# Patient Record
Sex: Male | Born: 1950 | Race: White | Hispanic: No | Marital: Married | Smoking: Never smoker
Health system: Southern US, Community
[De-identification: ages and names within clinical notes are randomized; demographics above are authoritative.]

## PROBLEM LIST (undated history)

## (undated) DIAGNOSIS — F329 Major depressive disorder, single episode, unspecified: Principal | ICD-10-CM

## (undated) DIAGNOSIS — M1990 Unspecified osteoarthritis, unspecified site: Secondary | ICD-10-CM

## (undated) DIAGNOSIS — K219 Gastro-esophageal reflux disease without esophagitis: Secondary | ICD-10-CM

## (undated) DIAGNOSIS — I8390 Asymptomatic varicose veins of unspecified lower extremity: Secondary | ICD-10-CM

## (undated) DIAGNOSIS — E785 Hyperlipidemia, unspecified: Secondary | ICD-10-CM

## (undated) DIAGNOSIS — E039 Hypothyroidism, unspecified: Secondary | ICD-10-CM

## (undated) DIAGNOSIS — M6740 Ganglion, unspecified site: Secondary | ICD-10-CM

## (undated) HISTORY — PX: COLONOSCOPY: SHX174

## (undated) HISTORY — DX: Major depressive disorder, single episode, unspecified: F32.9

## (undated) HISTORY — DX: Ganglion, unspecified site: M67.40

## (undated) HISTORY — DX: Hyperlipidemia, unspecified: E78.5

## (undated) HISTORY — DX: Asymptomatic varicose veins of unspecified lower extremity: I83.90

## (undated) HISTORY — DX: Hypothyroidism, unspecified: E03.9

---

## 2003-12-23 ENCOUNTER — Encounter: Payer: Self-pay | Admitting: Internal Medicine

## 2008-08-07 ENCOUNTER — Encounter: Payer: Self-pay | Admitting: Internal Medicine

## 2008-09-05 ENCOUNTER — Ambulatory Visit: Payer: Self-pay | Admitting: Gastroenterology

## 2008-11-14 ENCOUNTER — Ambulatory Visit: Payer: Self-pay | Admitting: Gastroenterology

## 2008-11-26 ENCOUNTER — Ambulatory Visit: Payer: Self-pay | Admitting: Gastroenterology

## 2008-12-05 ENCOUNTER — Telehealth: Payer: Self-pay | Admitting: Gastroenterology

## 2008-12-10 ENCOUNTER — Telehealth (INDEPENDENT_AMBULATORY_CARE_PROVIDER_SITE_OTHER): Payer: Self-pay | Admitting: *Deleted

## 2008-12-17 ENCOUNTER — Ambulatory Visit: Payer: Self-pay | Admitting: Internal Medicine

## 2008-12-17 DIAGNOSIS — M109 Gout, unspecified: Secondary | ICD-10-CM | POA: Insufficient documentation

## 2008-12-17 DIAGNOSIS — M79609 Pain in unspecified limb: Secondary | ICD-10-CM

## 2008-12-17 DIAGNOSIS — R21 Rash and other nonspecific skin eruption: Secondary | ICD-10-CM | POA: Insufficient documentation

## 2008-12-17 DIAGNOSIS — R0989 Other specified symptoms and signs involving the circulatory and respiratory systems: Secondary | ICD-10-CM | POA: Insufficient documentation

## 2008-12-22 ENCOUNTER — Encounter: Payer: Self-pay | Admitting: Internal Medicine

## 2008-12-22 ENCOUNTER — Ambulatory Visit: Payer: Self-pay

## 2009-05-01 ENCOUNTER — Ambulatory Visit: Payer: Self-pay | Admitting: Internal Medicine

## 2009-05-01 DIAGNOSIS — R609 Edema, unspecified: Secondary | ICD-10-CM

## 2009-05-01 DIAGNOSIS — M6740 Ganglion, unspecified site: Secondary | ICD-10-CM | POA: Insufficient documentation

## 2009-05-01 DIAGNOSIS — I8310 Varicose veins of unspecified lower extremity with inflammation: Secondary | ICD-10-CM

## 2009-06-24 ENCOUNTER — Encounter: Payer: Self-pay | Admitting: Internal Medicine

## 2009-06-24 ENCOUNTER — Ambulatory Visit: Payer: Self-pay | Admitting: Vascular Surgery

## 2009-07-27 ENCOUNTER — Ambulatory Visit: Payer: Self-pay | Admitting: Internal Medicine

## 2009-09-19 HISTORY — PX: VARICOSE VEIN SURGERY: SHX832

## 2009-10-02 ENCOUNTER — Ambulatory Visit: Payer: Self-pay | Admitting: Vascular Surgery

## 2009-10-02 ENCOUNTER — Encounter: Payer: Self-pay | Admitting: Internal Medicine

## 2009-11-25 ENCOUNTER — Ambulatory Visit: Payer: Self-pay | Admitting: Vascular Surgery

## 2009-12-02 ENCOUNTER — Encounter: Payer: Self-pay | Admitting: Internal Medicine

## 2009-12-02 ENCOUNTER — Ambulatory Visit: Payer: Self-pay | Admitting: Vascular Surgery

## 2010-04-12 ENCOUNTER — Telehealth: Payer: Self-pay | Admitting: Internal Medicine

## 2010-04-12 ENCOUNTER — Telehealth (INDEPENDENT_AMBULATORY_CARE_PROVIDER_SITE_OTHER): Payer: Self-pay | Admitting: *Deleted

## 2010-04-14 ENCOUNTER — Telehealth: Payer: Self-pay | Admitting: Internal Medicine

## 2010-10-19 NOTE — Progress Notes (Signed)
----   Converted from flag ---- ---- 04/12/2010 2:41 PM, Verdell Face wrote:   ---- 04/12/2010 12:30 PM, Verdell Face wrote: George Fitzgerald  (2051/08/07)  Pt requesting prescription for poison ivy, other the counter products are not working for him he requests something stronger, arm & elbows & knees have the rash.  [facility]  [phone] #   Elnita Maxwell ------------------------------

## 2010-10-19 NOTE — Progress Notes (Signed)
Summary: triamcinolone  Phone Note Refill Request Message from:  Pharmacy  Triamcinolon 0.5% is on back order until middle of Aug.....can we change med or strength?  please advise   Method Requested: Electronic Initial call taken by: Lanier Prude, [facility]),  April 14, 2010 5:03 PM  Follow-up for Phone Call        ok 0.25% Follow-up by: Tresa Garter MD,  April 14, 2010 5:25 PM    New/Updated Medications: TRIAMCINOLONE ACETONIDE 0.025 % CREA (TRIAMCINOLONE ACETONIDE) use two times a day as needed Prescriptions: TRIAMCINOLONE ACETONIDE 0.025 % CREA (TRIAMCINOLONE ACETONIDE) use two times a day as needed  #120 x 3   Entered by:   Lamar Sprinkles, CMA   Authorized by:   Tresa Garter MD   Signed by:   Lamar Sprinkles, CMA on 04/14/2010   Method used:   Electronically to        [facility] (retail)       [address]       [place], [place]  [address]       Ph: [phone]       Fax: [phone]   RxID:   [id number]

## 2010-10-19 NOTE — Letter (Signed)
Summary: [facility]   Imported By: Lester [place] 11/03/2009 10:35:33  _____________________________________________________________________  External Attachment:    Type:   Image     Comment:   External Document

## 2010-10-19 NOTE — Progress Notes (Signed)
Summary: POSION IVY  ---- Converted from flag ---- ---- 04/12/2010 12:30 PM, Verdell Face wrote: George Fitzgerald  ([birth year])  Pt requesting prescription for poison ivy, other the counter products are not working for him he requests something stronger, arm & elbows & knees have the rash.  [facility]  [phone] #   Elnita Maxwell ------------------------------     Follow-up for Phone Call       Follow-up by: Tresa Garter MD,  April 12, 2010 10:14 PM    Additional Follow-up for Phone Call Additional follow up Details #2::    ok Pred and Triamc Follow-up by: Tresa Garter MD,  April 12, 2010 10:15 PM  Additional Follow-up for Phone Call Additional follow up Details #3:: Details for Additional Follow-up Action Taken: Pt informed  Additional Follow-up by: Lamar Sprinkles, CMA,  April 13, 2010 9:18 AM  New/Updated Medications: PREDNISONE 10 MG TABS (PREDNISONE) Take 40mg  qd for 3 days, then 20 mg qd for 3 days, then 10mg  qd for 6 days, then stop. Take pc. TRIAMCINOLONE ACETONIDE 0.5 % CREA (TRIAMCINOLONE ACETONIDE) use two times a day prn Prescriptions: TRIAMCINOLONE ACETONIDE 0.5 % CREA (TRIAMCINOLONE ACETONIDE) use two times a day prn  #120 g x 3   Entered and Authorized by:   Tresa Garter MD   Signed by:   Lamar Sprinkles, CMA on 04/13/2010   Method used:   Electronically to        [facility] (retail)       [address]       [place], [place]  [address]       Ph: [phone]       Fax: [phone]   RxID:   [id number] PREDNISONE 10 MG TABS (PREDNISONE) Take 40mg  qd for 3 days, then 20 mg qd for 3 days, then 10mg  qd for 6 days, then stop. Take pc.  #24 x 1   Entered and Authorized by:   Tresa Garter MD   Signed by:   Lamar Sprinkles, CMA on 04/13/2010   Method used:   Electronically to        [facility] (retail)       [address]       [place], [place]  [address]       Ph:  [phone]       Fax: [phone]   RxID:   [id number]

## 2010-10-19 NOTE — Letter (Signed)
Summary: [facility]   Imported By: Lennie Odor 12/17/2009 16:14:59  _____________________________________________________________________  External Attachment:    Type:   Image     Comment:   External Document

## 2011-02-01 NOTE — Consult Note (Signed)
George Fitzgerald   CASH, DUCE  DOB:  10-02-1950                                       06/24/2009  ZOXWR#:[id number]   Patient presents today for evaluation of left leg varicose veins and  venous hypertension.  He reports that these varicosities have been  present for many years but have become progressively severe over the  past several years and is having increased pain associated with these.  He is quite active, though he is semi-retired.  He does a great deal of  standing and reports increased pain associated with prolonged standing.  He reports that the pain awakens him at right if he has been on his feet  quite a bit and has a throbbing sensation and also a sensation of heat  and distention over the varicosities themselves in his medial and  posterior calf on the left.  He does not have any history of deep venous  thrombosis or superficial thrombophlebitis.  His heath is good.  He does  have elevated cholesterol.  He does have a history of gallop and does  have a history of ganglion cyst.  He does not have a history of  diabetes.  He does not have a family history is negative for premature  atherosclerotic disease.   SOCIAL HISTORY:  He is married.  He is retired.  He does not smoke.  Does have several alcohol drinks per day.   REVIEW OF SYSTEMS:  Weight is reported at 192 pounds.  He is 6 feet 2  inches tall.  He does not have any cardiac, pulmonary, GI or GU  symptoms.  Has no neuro past history and no psychiatric past history.  He does have a history of gout and pain in his left leg from the  varicosities.   He does take Aleve for sustained relief for discomfort in his  varicosities.   There are no drug allergies.   His only other medications are low-dose aspirin, glucosamine, omega 3  fish oil, and vitamin D.   PHYSICAL EXAMINATION:  A well-developed and well-nourished white male  appearing stated age 60.  Blood pressure is 149/80.   Heart rates is 58.  Respirations are 18.  His radial and dorsalis pedis pulses are 2+  bilaterally.  Right leg has no evidence of venous hypertension.  The  left leg has marked varicosities in his medial calf and posterior calf.  He underwent noninvasive vascular laboratory studies and this shows  gross reflux throughout his great saphenous vein and flow into the  tributary varicosities.   I discussed the significance of his venous hypertension as the cause of  his pain and varicosities with the patient.  We have fitted him with  thigh-high graduated compression stockings 20 to 30 mmHg today.  I  explained the importance of the continued use of Aleve and elevation  when possible.  I did discuss the potential option of laser ablation of  his great saphenous vein and stab phlebectomy for relief symptoms should  the compression garments fail.  Plan to see him again in 3 months for  continued discussion.   Larina Earthly, M.D.  Electronically Signed   TFE/MEDQ  D:  06/24/2009  T:  06/25/2009  Job:  [id number]   cc:   Georgina Quint. Plotnikov, MD

## 2011-02-01 NOTE — Assessment & Plan Note (Signed)
OFFICE VISIT   George Fitzgerald, George Fitzgerald  DOB:  04-19-1951                                       10/02/2009  ZOXWR#:[id number]   The patient presents today for continue discussion regarding his venous  hypertension in his left leg.  He has no new major medical difficulties.   Review of systems is otherwise unchanged.  He reports that he continues  to have severe discomfort with his walking program.  He reports that he  has a throbbing sensation in his left calf and this is somewhat relieved  with exercise.  He does report that he has had no relief from the  compression garments.  He does take Aleve several times a day to help  with the pain as well.  He has stopped walking on a treadmill due to leg  pain and swelling and reports that he can only walk 10% of what he used  to walk due to pain.  He also has had to limit his yard work, mowing,  cleaning gutters, etc. due to leg pain as well.  He reports an aching  sensation at night after being up on his leg a great deal of time.   PHYSICAL EXAMINATION:  Unchanged.  He has a 2+ dorsalis pedis pulse.  Atraumatic, normocephalic.  Extraocular movements intact.  His skin is  without rashes.  He does have some hemosiderin deposits from the level  of his ankle.  His blood pressure today is 161/87, pulses 66,  respirations 18.  He has no major musculoskeletal deformities.  He is  grossly intact neurologically.   I reviewed his duplex with him and reimaged his vein for demonstration  for the patient with ultrasound showing markedly dilated great saphenous  vein throughout his left thigh extending into his tributary varicosities  in the calf popliteal space.  I have recommended we proceed with laser  ablation of his left great saphenous vein and stab phlebectomy of his  tributary varicosities.  He understands this is an outpatient procedure  in our office under local anesthesia.  We will schedule this at his  earliest  convenience.     Larina Earthly, M.D.  Electronically Signed   TFE/MEDQ  D:  10/02/2009  T:  10/02/2009  Job:  [id number]   cc:   Georgina Quint. Plotnikov, MD

## 2011-02-01 NOTE — Procedures (Signed)
DUPLEX DEEP VENOUS EXAM - LOWER EXTREMITY   INDICATION:  One week left greater saphenous vein ablation postop.   HISTORY:  Edema:  No.  Trauma/Surgery:  One week, left greater saphenous vein ablation.  Pain:  No.  PE:  No.  Previous DVT:  No.  Anticoagulants:  No.  Other:  No.   DUPLEX EXAM:                CFV   SFV   PopV  PTV    GSV                R  L  R  L  R  L  R   L  R  L  Thrombosis    o  o     o     o      o     +  Spontaneous   +  +     +     +      +     o  Phasic        +  +     +     +      +     o  Augmentation  +  +     +     +      +     o  Compressible  +  +     +     +      +     o  Competent     +  +     +     +      +     o   Legend:  + - yes  o - no  p - partial  D - decreased   IMPRESSION:  There does not appear to be any deep vein thrombus noted in  the left leg.  The left greater saphenous vein appears thrombosed at the  left saphenofemoral junction to knee level.    _____________________________  Larina Earthly, M.D.   CB/MEDQ  D:  12/02/2009  T:  12/02/2009  Job:  [id number]

## 2011-02-01 NOTE — Assessment & Plan Note (Signed)
OFFICE VISIT   George Fitzgerald, George Fitzgerald  DOB:  02-09-1951                                       11/25/2009  EAVWU#:[id number]   The patient presents today for treatment of his left leg venous  pathology.  He underwent laser ablation of his left great saphenous vein  from below his knee to his saphenofemoral junction and also stab  phlebectomy of multiple tributary varicosities between 10 and 20 over  his posterior medial calf.  He had no immediately complications and was  discharged to home and will be seen again in 1 week for repeat duplex.     Larina Earthly, M.D.  Electronically Signed   TFE/MEDQ  D:  11/25/2009  T:  11/26/2009  Job:  [id number]

## 2011-02-01 NOTE — Procedures (Signed)
LOWER EXTREMITY VENOUS REFLUX EXAM   INDICATION:  Bulky varicose veins.   EXAM:  Using color-flow imaging and pulse Doppler spectral analysis, the  left common femoral, superficial femoral, popliteal, posterior tibial,  greater and lesser saphenous veins are evaluated.  There is evidence  suggesting deep venous insufficiency in the left lower extremity.   The left saphenofemoral junction is not competent with reflux of >500  milliseconds.  The left GSV is not competent with reflux of >500  milliseconds, with the caliber as described below.   The left proximal short saphenous vein demonstrates competency.   GSV Diameter (used if found to be incompetent only)                                            Right         Left  Proximal Greater Saphenous Vein           cm            1.00 cm  Proximal-to-mid-thigh                     cm            0.76 cm  Mid thigh                                 cm            0.70 cm  Mid-distal thigh                          cm            cm  Distal thigh                              cm            0.77 cm  Knee                                      cm            0.58 cm   IMPRESSION:  The left greater saphenous vein reflux with >500  milliseconds is identified with the caliber ranging from 0.58 cm to 1.00  cm knee to groin.  The deep venous systems is not competent with reflux  of >500 milliseconds.  The left lesser saphenous vein is competent.   ___________________________________________  Larina Earthly, M.D.   CB/MEDQ  D:  06/24/2009  T:  06/24/2009  Job:  [id number]

## 2011-02-01 NOTE — Assessment & Plan Note (Signed)
OFFICE VISIT   George Fitzgerald, George Fitzgerald  DOB:  Oct 27, 1950                                       12/02/2009  ZOXWR#:[id number]   The patient presents today 1 week followup of left great saphenous vein  laser ablation and stab phlebectomy of multiple tributary varicosities.  He has had minimal discomfort following the procedure and has returned  to his usual activities.  He has nice healing of his stab sites and mild  bruising as well.  He underwent repeat duplex today and this reveals  closure of the saphenous vein from the knee to the saphenofemoral  junction and also no evidence of DVT.  I am pleased with his initial  result as is the patient.  I plan to see him again in 6 weeks for final  followup.  He will call and cancel his appointment should he have no  questions regarding his resolution.     Larina Earthly, M.D.  Electronically Signed   TFE/MEDQ  D:  12/02/2009  T:  12/03/2009  Job:  [id number]   cc:   Georgina Quint. Plotnikov, MD

## 2011-02-16 ENCOUNTER — Other Ambulatory Visit (INDEPENDENT_AMBULATORY_CARE_PROVIDER_SITE_OTHER): Payer: Self-pay

## 2011-02-16 DIAGNOSIS — Z0000 Encounter for general adult medical examination without abnormal findings: Secondary | ICD-10-CM

## 2011-02-16 DIAGNOSIS — Z0389 Encounter for observation for other suspected diseases and conditions ruled out: Secondary | ICD-10-CM

## 2011-02-16 LAB — CBC WITH DIFFERENTIAL/PLATELET
Eosinophils Relative: 1.8 % (ref 0.0–5.0)
HCT: 40.5 % (ref 39.0–52.0)
Lymphocytes Relative: 19 % (ref 12.0–46.0)
Monocytes Relative: 11.1 % (ref 3.0–12.0)
Neutrophils Relative %: 67.2 % (ref 43.0–77.0)
Platelets: 194 10*3/uL (ref 150.0–400.0)
WBC: 4.7 10*3/uL (ref 4.5–10.5)

## 2011-02-16 LAB — PSA: PSA: 0.53 ng/mL (ref 0.10–4.00)

## 2011-02-16 LAB — TSH: TSH: 3.94 u[IU]/mL (ref 0.35–5.50)

## 2011-02-16 LAB — URINALYSIS
Leukocytes, UA: NEGATIVE
Nitrite: NEGATIVE
Specific Gravity, Urine: 1.025 (ref 1.000–1.030)
Total Protein, Urine: NEGATIVE
pH: 6 (ref 5.0–8.0)

## 2011-02-16 LAB — HEPATIC FUNCTION PANEL
AST: 26 U/L (ref 0–37)
Albumin: 4.3 g/dL (ref 3.5–5.2)
Total Protein: 6.9 g/dL (ref 6.0–8.3)

## 2011-02-16 LAB — LIPID PANEL
Cholesterol: 240 mg/dL — ABNORMAL HIGH (ref 0–200)
Total CHOL/HDL Ratio: 4
Triglycerides: 72 mg/dL (ref 0.0–149.0)
VLDL: 14.4 mg/dL (ref 0.0–40.0)

## 2011-02-16 LAB — BASIC METABOLIC PANEL
Chloride: 104 mEq/L (ref 96–112)
GFR: 68.49 mL/min (ref >60.00)
Potassium: 4.6 mEq/L (ref 3.5–5.1)

## 2011-02-21 ENCOUNTER — Encounter: Payer: Self-pay | Admitting: Internal Medicine

## 2011-02-23 ENCOUNTER — Encounter: Payer: Self-pay | Admitting: Internal Medicine

## 2011-02-23 ENCOUNTER — Ambulatory Visit (INDEPENDENT_AMBULATORY_CARE_PROVIDER_SITE_OTHER): Payer: BC Managed Care – PPO | Admitting: Internal Medicine

## 2011-02-23 VITALS — BP 128/88 | HR 62 | Temp 97.4°F | Ht 74.0 in | Wt 199.0 lb

## 2011-02-23 DIAGNOSIS — R0982 Postnasal drip: Secondary | ICD-10-CM

## 2011-02-23 DIAGNOSIS — Z0000 Encounter for general adult medical examination without abnormal findings: Secondary | ICD-10-CM | POA: Insufficient documentation

## 2011-02-23 DIAGNOSIS — R21 Rash and other nonspecific skin eruption: Secondary | ICD-10-CM

## 2011-02-23 DIAGNOSIS — E785 Hyperlipidemia, unspecified: Secondary | ICD-10-CM

## 2011-02-23 DIAGNOSIS — Z23 Encounter for immunization: Secondary | ICD-10-CM

## 2011-02-23 DIAGNOSIS — M6740 Ganglion, unspecified site: Secondary | ICD-10-CM

## 2011-02-23 MED ORDER — ATORVASTATIN CALCIUM 10 MG PO TABS: 10.0000 mg | ORAL_TABLET | Freq: Every day | ORAL | Status: DC

## 2011-02-23 MED ORDER — DOXYCYCLINE HYCLATE 100 MG PO TABS: 100.0000 mg | ORAL_TABLET | Freq: Two times a day (BID) | ORAL | Status: AC

## 2011-02-23 NOTE — Assessment & Plan Note (Signed)
Will try Lipitor 

## 2011-02-23 NOTE — Assessment & Plan Note (Signed)
We discussed age appropriate health related issues, including available/recomended screening tests and vaccinations. We discussed a need for adhering to healthy diet and exercise. Labs/EKG were reviewed/ordered. All questions were answered.   

## 2011-02-23 NOTE — Assessment & Plan Note (Signed)
Doxy

## 2011-02-23 NOTE — Assessment & Plan Note (Signed)
Treat GERD Netty pot

## 2011-02-23 NOTE — Progress Notes (Signed)
  Subjective:    Patient ID: George Fitzgerald, male    DOB: 1950/10/20, 60 y.o.   MRN: [id number]  HPI  The patient is here for a wellness exam. The patient has been doing well overall without major physical or psychological issues going on lately.  C/o L foot and R wrist cysts - they hurt  Review of Systems  Constitutional: Negative for appetite change, fatigue and unexpected weight change.  HENT: Negative for nosebleeds, congestion, sore throat, sneezing, trouble swallowing and neck pain.   Eyes: Negative for itching and visual disturbance.  Respiratory: Negative for cough.   Cardiovascular: Negative for chest pain, palpitations and leg swelling.  Gastrointestinal: Negative for nausea, diarrhea, blood in stool and abdominal distention.  Genitourinary: Negative for frequency and hematuria.  Musculoskeletal: Negative for back pain, joint swelling and gait problem.  Skin: Positive for rash.  Neurological: Negative for dizziness, tremors, speech difficulty and weakness.  Psychiatric/Behavioral: Negative for sleep disturbance, dysphoric mood and agitation. The patient is not nervous/anxious.        Objective:   Physical Exam  Constitutional: He is oriented to person, place, and time. He appears well-developed and well-nourished. No distress.  HENT:  Head: Normocephalic and atraumatic.  Right Ear: External ear normal.  Left Ear: External ear normal.  Nose: Nose normal.  Mouth/Throat: Oropharynx is clear and moist. No oropharyngeal exudate.  Eyes: Conjunctivae and EOM are normal. Pupils are equal, round, and reactive to light. Right eye exhibits no discharge. Left eye exhibits no discharge. No scleral icterus.  Neck: Normal range of motion. Neck supple. No JVD present. No tracheal deviation present. No thyromegaly present.  Cardiovascular: Normal rate, regular rhythm, normal heart sounds and intact distal pulses.  Exam reveals no gallop and no friction rub.   No murmur  heard. Pulmonary/Chest: Effort normal and breath sounds normal. No stridor. No respiratory distress. He has no wheezes. He has no rales. He exhibits no tenderness.  Abdominal: Soft. Bowel sounds are normal. He exhibits no distension and no mass. There is no tenderness. There is no rebound and no guarding.  Genitourinary: Rectum normal, prostate normal and penis normal. Guaiac negative stool. No penile tenderness.  Musculoskeletal: Normal range of motion. He exhibits no edema and no tenderness.       R wrist cyst and L foot cyst  Lymphadenopathy:    He has no cervical adenopathy.  Neurological: He is alert and oriented to person, place, and time. He has normal reflexes. No cranial nerve deficit. He exhibits normal muscle tone. Coordination normal.  Skin: Skin is warm and dry. Rash (B shins - ulcers 6 mm B with cellulitis x 4 total) noted. He is not diaphoretic. No erythema. No pallor.  Psychiatric: He has a normal mood and affect. His behavior is normal. Judgment and thought content normal.       Lab Results  Component Value Date   WBC 4.7 02/16/2011   HGB 14.0 02/16/2011   HCT 40.5 02/16/2011   PLT 194.0 02/16/2011   CHOL 240* 02/16/2011   TRIG 72.0 02/16/2011   HDL 63.00 02/16/2011   LDLDIRECT 186.9 02/16/2011   ALT 26 02/16/2011   AST 26 02/16/2011   NA 136 02/16/2011   K 4.6 02/16/2011   CL 104 02/16/2011   CREATININE 1.2 02/16/2011   BUN 18 02/16/2011   CO2 26 02/16/2011   TSH 3.94 02/16/2011   PSA 0.53 02/16/2011      Assessment & Plan:

## 2011-02-23 NOTE — Assessment & Plan Note (Signed)
Will drain and inject

## 2011-02-24 MED ORDER — TETANUS-DIPHTH-ACELL PERTUSSIS 5-2.5-18.5 LF-MCG/0.5 IM SUSP
0.5000 mL | Freq: Once | INTRAMUSCULAR | Status: AC
  Administered 2011-02-23: 0.5 mL via INTRAMUSCULAR

## 2011-03-01 ENCOUNTER — Encounter: Payer: Self-pay | Admitting: Internal Medicine

## 2011-03-01 ENCOUNTER — Ambulatory Visit (INDEPENDENT_AMBULATORY_CARE_PROVIDER_SITE_OTHER): Payer: BC Managed Care – PPO | Admitting: Internal Medicine

## 2011-03-01 VITALS — BP 110/80 | HR 72 | Temp 97.5°F | Resp 16 | Wt 198.0 lb

## 2011-03-01 DIAGNOSIS — M6740 Ganglion, unspecified site: Secondary | ICD-10-CM

## 2011-03-01 DIAGNOSIS — M79609 Pain in unspecified limb: Secondary | ICD-10-CM

## 2011-03-01 MED ORDER — METHYLPREDNISOLONE ACETATE 80 MG/ML IJ SUSP: 20.0000 mg | Freq: Once | INTRAMUSCULAR | Status: DC

## 2011-03-01 NOTE — Progress Notes (Signed)
  Subjective:    Patient ID: George Fitzgerald, male    DOB: 09/21/1950, 61 y.o.   MRN: [id number]  HPI  C/o L foot painful cyst  Review of Systems     Objective:   Physical Exam     L foot lat 1.5 x 1.2 cm cyst    Assessment & Plan:

## 2011-03-01 NOTE — Assessment & Plan Note (Addendum)
L foot  Procedure Note :    Procedure :   Sonography examination   Indication:     Equipment used: Sonosite M-Turbo HFL38x/13-6 MHz transducer linear probe. The images were stored in the unit and later transferred in storage.  The patient was placed in a decubitus position.   This study revealed a subcutaneous heteroechotic lesion on L lat foot c/w ganglion. Entry point was marked.   Impression: L foot ganglion cyst  Procedure:  Cyst aspiration  Indication:  Growing cyst aspiration and steroid injection  Risks including bleeding, infection, relapse and others as well as benefits were explained to the patient in detail. He was placed in the decubitus position. Skin was prepped with Betadine and alcohol and injected with 1 cc of 2% lidocaine. 20 gauge needle was introduced in the cyst and 1 cc of clear gel-like material was aspirated and discarded. The cavity was injected with 10 mg of Depo-medrol and 0.5 cc of 2 % Lidocaine. Bandaid. ACE wrap.  Tolerated well. Complications none. Instructions provided.

## 2011-03-13 NOTE — Assessment & Plan Note (Signed)
Will aspirate 

## 2011-06-02 ENCOUNTER — Ambulatory Visit (INDEPENDENT_AMBULATORY_CARE_PROVIDER_SITE_OTHER): Payer: BC Managed Care – PPO | Admitting: Endocrinology

## 2011-06-02 ENCOUNTER — Encounter: Payer: Self-pay | Admitting: Endocrinology

## 2011-06-02 ENCOUNTER — Other Ambulatory Visit (INDEPENDENT_AMBULATORY_CARE_PROVIDER_SITE_OTHER): Payer: BC Managed Care – PPO

## 2011-06-02 ENCOUNTER — Ambulatory Visit (INDEPENDENT_AMBULATORY_CARE_PROVIDER_SITE_OTHER)
Admission: RE | Admit: 2011-06-02 | Discharge: 2011-06-02 | Disposition: A | Discharge status: Home / Self Care | Payer: BC Managed Care – PPO | Source: Ambulatory Visit | Attending: Endocrinology | Admitting: Endocrinology

## 2011-06-02 VITALS — BP 118/74 | HR 58 | Temp 97.9°F | Ht 73.5 in | Wt 200.4 lb

## 2011-06-02 DIAGNOSIS — M25569 Pain in unspecified knee: Secondary | ICD-10-CM

## 2011-06-02 DIAGNOSIS — M25561 Pain in right knee: Secondary | ICD-10-CM | POA: Insufficient documentation

## 2011-06-02 LAB — CBC WITH DIFFERENTIAL/PLATELET
Basophils Relative: 0.8 % (ref 0.0–3.0)
Eosinophils Relative: 2.2 % (ref 0.0–5.0)
HCT: 40.9 % (ref 39.0–52.0)
Hemoglobin: 13.7 g/dL (ref 13.0–17.0)
Lymphs Abs: 1.5 10*3/uL (ref 0.7–4.0)
MCV: 100.9 fl — ABNORMAL HIGH (ref 78.0–100.0)
Monocytes Absolute: 0.6 10*3/uL (ref 0.1–1.0)
Neutro Abs: 4.6 10*3/uL (ref 1.4–7.7)
Neutrophils Relative %: 66.7 % (ref 43.0–77.0)
RBC: 4.05 Mil/uL — ABNORMAL LOW (ref 4.22–5.81)
WBC: 6.9 10*3/uL (ref 4.5–10.5)

## 2011-06-02 MED ORDER — COLCHICINE 0.6 MG PO TABS: ORAL_TABLET | ORAL | Status: DC

## 2011-06-02 NOTE — Patient Instructions (Addendum)
An x-ray, and blood tests are being requested for you today.  please call [phone] to hear your test results.  You will be prompted to enter the 9-digit "MRN" number that appears at the top left of this page, followed by #.  Then you will hear the message.   i have sent a prescription to your pharmacy, for gout medication.  You will get better much faster if you elevate your right knee above the rest of your body. Skip lipitor for 1 day after taking the gout medication, due to an interaction. I hope you feel better soon.  If you don't feel better by next week, please call doctor plotnikov.. (update: i left message on phone-tree:  rx as we discussed.   Call if you want to see ortho)

## 2011-06-02 NOTE — Progress Notes (Signed)
  Subjective:    Patient ID: George Fitzgerald, male    DOB: 26-Oct-1950, 60 y.o.   MRN: [id number]  HPI Pt states 1 day of slight swelling of the right knee, and assoc pain.  He worked out yesterday, but no local injury.  He has h/o gout, but no h/o knee probs.   Past Medical History  Diagnosis Date  . Hyperlipidemia   . Ganglion cyst     L wrist, R foot  . Gout   . Varicose veins     L    Past Surgical History  Procedure Date  . Varicose vein surgery 2011    History   Social History  . Marital Status: Married    Spouse Name: N/A    Number of Children: 1  . Years of Education: N/A   Occupational History  . retired    Social History Main Topics  . Smoking status: Never Smoker   . Smokeless tobacco: Not on file  . Alcohol Use: Yes  . Drug Use: No  . Sexually Active: Yes   Other Topics Concern  . Not on file   Social History Narrative   Regular exercise-yes,sailor    Current Outpatient Prescriptions on File Prior to Visit  Medication Sig Dispense Refill  . aspirin 81 MG tablet Take 81 mg by mouth daily.        Marland Kitchen atorvastatin (LIPITOR) 10 MG tablet Take 1 tablet (10 mg total) by mouth daily.  30 tablet  11  . Cholecalciferol (VITAMIN D3) 2000 UNITS capsule Take 2,000 Units by mouth daily.       . Glucosamine-Chondroit-Vit C-Mn (GLUCOSAMINE CHONDR 1500 COMPLX) CAPS Take 1 capsule by mouth daily.        . naproxen sodium (ANAPROX) 220 MG tablet Take 220 mg by mouth 2 (two) times daily with a meal.        . Omega-3 Fatty Acids (FISH OIL) 1000 MG CAPS Take 1 capsule by mouth daily.         Current Facility-Administered Medications on File Prior to Visit  Medication Dose Route Frequency Provider Last Rate Last Dose  . methylPREDNISolone acetate (DEPO-MEDROL) injection 20 mg  20 mg Intra-Lesional Once Sonda Primes, MD       No Known Allergies  Family History  Problem Relation Age of Onset  . Stroke Mother   . Nephritis Father   . Kidney disease Father 36    nephritis      BP 118/74  Pulse 58  Temp(Src) 97.9 F (36.6 C) (Oral)  Ht 6' 1.5" (1.867 m)  Wt 200 lb 6.4 oz (90.901 kg)  BMI 26.08 kg/m2  SpO2 98%  Review of Systems Denies fever.  No other athralgias    Objective:   Physical Exam VITAL SIGNS:  See vs page GENERAL: no distress Right knee:  At the superior aspect, there is slight swelling/tend/warmth.  rom is limited to 70 degrees, by pain.    (i reviewed lab and x-ray results)    Assessment & Plan:  Gout, needs increased rx Knee pain, new.  prob due to gout Dyslipidemia.  There is a potential interaction between lipitor and colchicine

## 2011-06-27 ENCOUNTER — Other Ambulatory Visit (INDEPENDENT_AMBULATORY_CARE_PROVIDER_SITE_OTHER): Payer: BC Managed Care – PPO

## 2011-06-27 ENCOUNTER — Telehealth: Payer: Self-pay | Admitting: *Deleted

## 2011-06-27 ENCOUNTER — Other Ambulatory Visit: Payer: Self-pay | Admitting: Internal Medicine

## 2011-06-27 DIAGNOSIS — E785 Hyperlipidemia, unspecified: Secondary | ICD-10-CM

## 2011-06-27 LAB — LDL CHOLESTEROL, DIRECT: Direct LDL: 174.6 mg/dL

## 2011-06-27 LAB — COMPREHENSIVE METABOLIC PANEL
AST: 26 U/L (ref 0–37)
Albumin: 4.5 g/dL (ref 3.5–5.2)
Alkaline Phosphatase: 61 U/L (ref 39–117)
Glucose, Bld: 116 mg/dL — ABNORMAL HIGH (ref 70–99)
Potassium: 4.9 mEq/L (ref 3.5–5.1)
Sodium: 140 mEq/L (ref 135–145)
Total Protein: 7.3 g/dL (ref 6.0–8.3)

## 2011-06-27 LAB — LIPID PANEL: HDL: 72 mg/dL (ref >39.00)

## 2011-06-27 NOTE — Telephone Encounter (Signed)
Pt walked in requesting Uric Acid be added to his labs that were drawn this am. I saw this was just checked 06-02-11. Ok to add/check again?

## 2011-06-27 NOTE — Telephone Encounter (Signed)
Ok to add thx

## 2011-06-28 NOTE — Telephone Encounter (Signed)
done

## 2011-06-29 ENCOUNTER — Ambulatory Visit (INDEPENDENT_AMBULATORY_CARE_PROVIDER_SITE_OTHER): Payer: BC Managed Care – PPO | Admitting: Internal Medicine

## 2011-06-29 ENCOUNTER — Encounter: Payer: Self-pay | Admitting: Internal Medicine

## 2011-06-29 ENCOUNTER — Other Ambulatory Visit: Payer: BC Managed Care – PPO

## 2011-06-29 ENCOUNTER — Telehealth: Payer: Self-pay | Admitting: Internal Medicine

## 2011-06-29 VITALS — BP 130/96 | HR 68 | Temp 97.9°F | Resp 16 | Wt 198.0 lb

## 2011-06-29 DIAGNOSIS — M25561 Pain in right knee: Secondary | ICD-10-CM

## 2011-06-29 DIAGNOSIS — M25469 Effusion, unspecified knee: Secondary | ICD-10-CM

## 2011-06-29 DIAGNOSIS — M109 Gout, unspecified: Secondary | ICD-10-CM

## 2011-06-29 DIAGNOSIS — M25569 Pain in unspecified knee: Secondary | ICD-10-CM

## 2011-06-29 DIAGNOSIS — Z23 Encounter for immunization: Secondary | ICD-10-CM

## 2011-06-29 DIAGNOSIS — M6740 Ganglion, unspecified site: Secondary | ICD-10-CM

## 2011-06-29 MED ORDER — MELOXICAM 15 MG PO TABS: 15.0000 mg | ORAL_TABLET | Freq: Every day | ORAL | Status: DC | PRN

## 2011-06-29 MED ORDER — METHYLPREDNISOLONE ACETATE 40 MG/ML IJ SUSP: 40.0000 mg | Freq: Once | INTRAMUSCULAR | Status: DC

## 2011-06-29 MED ORDER — FEBUXOSTAT 40 MG PO TABS: 80.0000 mg | ORAL_TABLET | Freq: Every day | ORAL | Status: DC

## 2011-06-29 NOTE — Progress Notes (Signed)
  Subjective:    Patient ID: George Fitzgerald, male    DOB: 09-29-1950, 60 y.o.   MRN: [id number]  HPI    Review of Systems     Objective:   Physical Exam Procedure Note :    Procedure :   Point of care (POC) sonography examination   Indication: R knee swelling   Equipment used: Sonosite M-Turbo with HFL38x/13-6 MHz transducer linear probe. The images were stored in the unit and later transferred in storage.  The patient was placed in a sitting position.  This study revealed a joint effusion   Impression: R knee joint effusion          Assessment & Plan:

## 2011-06-29 NOTE — Patient Instructions (Signed)
Postprocedure instructions :    A Band-Aid should be left on for 12 hours. Injection therapy is not a cure itself. It is used in conjunction with other modalities. You can use nonsteroidal anti-inflammatories like ibuprofen , hot and cold compresses. Rest is recommended in the next 24 hours. You need to report immediately  if fever, chills or any signs of infection develop. 

## 2011-06-29 NOTE — Telephone Encounter (Signed)
Misty Stanley, please, inform patient that his gout test is up  OV to address Thx

## 2011-06-29 NOTE — Assessment & Plan Note (Signed)
Painless swelling x 3 wks (10/12) ? Etiology See meds Knee was tapped - see procedure

## 2011-06-29 NOTE — Telephone Encounter (Signed)
Pt here for OV today.  

## 2011-06-29 NOTE — Assessment & Plan Note (Signed)
[facility] ref

## 2011-06-29 NOTE — Progress Notes (Signed)
Subjective:    Patient ID: George Fitzgerald, male    DOB: Mar 30, 1951, 60 y.o.   MRN: [id number]  HPI  C/o R knee swelling x 2-3 wks, NT F/u on gout test F/u L foot ganglion - it came back after the procedure  Review of Systems  Constitutional: Negative for appetite change, fatigue and unexpected weight change.  HENT: Negative for nosebleeds, congestion, sore throat, sneezing, trouble swallowing and neck pain.   Eyes: Negative for itching and visual disturbance.  Respiratory: Negative for cough.   Cardiovascular: Negative for chest pain, palpitations and leg swelling.  Gastrointestinal: Negative for nausea, diarrhea, blood in stool and abdominal distention.  Genitourinary: Negative for frequency and hematuria.  Musculoskeletal: Positive for joint swelling and arthralgias. Negative for back pain and gait problem.  Skin: Negative for rash.  Neurological: Negative for dizziness, tremors, speech difficulty and weakness.  Psychiatric/Behavioral: Negative for sleep disturbance, dysphoric mood and agitation. The patient is not nervous/anxious.        Objective:   Physical Exam  Constitutional: He is oriented to person, place, and time. He appears well-developed.  HENT:  Mouth/Throat: Oropharynx is clear and moist.  Eyes: Conjunctivae are normal. Pupils are equal, round, and reactive to light.  Neck: Normal range of motion. No JVD present. No thyromegaly present.  Cardiovascular: Normal rate, regular rhythm, normal heart sounds and intact distal pulses.  Exam reveals no gallop and no friction rub.   No murmur heard. Pulmonary/Chest: Effort normal and breath sounds normal. No respiratory distress. He has no wheezes. He has no rales. He exhibits no tenderness.  Abdominal: Soft. Bowel sounds are normal. He exhibits no distension and no mass. There is no tenderness. There is no rebound and no guarding.  Musculoskeletal: Normal range of motion. He exhibits edema (R knee). He exhibits no tenderness.         L ffot ganglion, lateral  Lymphadenopathy:    He has no cervical adenopathy.  Neurological: He is alert and oriented to person, place, and time. He has normal reflexes. No cranial nerve deficit. He exhibits normal muscle tone. Coordination normal.  Skin: Skin is warm and dry. No rash noted.  Psychiatric: He has a normal mood and affect. His behavior is normal. Judgment and thought content normal.      Lab Results  Component Value Date   WBC 6.9 06/02/2011   HGB 13.7 06/02/2011   HCT 40.9 06/02/2011   PLT 231.0 06/02/2011   GLUCOSE 116* 06/27/2011   CHOL 258* 06/27/2011   TRIG 85.0 06/27/2011   HDL 72.00 06/27/2011   LDLDIRECT 174.6 06/27/2011   ALT 28 06/27/2011   AST 26 06/27/2011   NA 140 06/27/2011   K 4.9 06/27/2011   CL 105 06/27/2011   CREATININE 1.2 06/27/2011   BUN 15 06/27/2011   CO2 29 06/27/2011   TSH 3.94 02/16/2011   PSA 0.53 02/16/2011    Procedure Note :     Procedure :Joint Injection,   knee   Indication:  Joint osteoarthritis with refractory  chronic pain.   Risks including unsuccessful procedure , bleeding, infection, bruising, skin atrophy and others were explained to the patient in detail as well as the benefits. Informed consent was obtained and signed.   Tthe patient was placed in a comfortable position. Lateral approach was used. Skin was prepped with Betadine and alcohol  and anesthetized with 2 cc of 2% lidocaine and epinephrine, using a 25-gauge 1-1/2 inch needle. Then, a 10 cc syringe with a 2  inch long 22-gauge needle was used for a joint aspiration and injection.. The needle was advanced  Into the knee joint cavity. I aspirated 30 cc of yellow intra-articular fluid  and injected the joint with 5 mL of 2% lidocaine and 40 mg of Depo-Medrol .  Band-Aid was applied.   Tolerated well. Complications: None. Good pain relief following the procedure.   Postprocedure instructions :    A Band-Aid should be left on for 12 hours. Injection therapy is not a cure  itself. It is used in conjunction with other modalities. You can use nonsteroidal anti-inflammatories like ibuprofen , hot and cold compresses. Rest is recommended in the next 24 hours. You need to report immediately  if fever, chills or any signs of infection develop.     Assessment & Plan:

## 2011-06-29 NOTE — Assessment & Plan Note (Signed)
Mostly involving feet joints See meds

## 2011-06-30 LAB — OTHER SOLSTAS TEST
Eosinophils-Synovial: 0 % (ref 0–1)
Glucose, Synovial Fluid: 87 mg/dL
Total protein, fluid: 3 g/dL

## 2011-07-03 LAB — BODY FLUID CULTURE

## 2011-07-25 ENCOUNTER — Encounter (HOSPITAL_BASED_OUTPATIENT_CLINIC_OR_DEPARTMENT_OTHER): Payer: Self-pay | Admitting: *Deleted

## 2011-07-27 ENCOUNTER — Encounter: Payer: Self-pay | Admitting: Internal Medicine

## 2011-07-27 ENCOUNTER — Ambulatory Visit (INDEPENDENT_AMBULATORY_CARE_PROVIDER_SITE_OTHER): Payer: BC Managed Care – PPO | Admitting: Internal Medicine

## 2011-07-27 VITALS — BP 120/86 | HR 88 | Temp 98.0°F | Resp 16 | Wt 192.0 lb

## 2011-07-27 DIAGNOSIS — M109 Gout, unspecified: Secondary | ICD-10-CM

## 2011-07-27 DIAGNOSIS — M25539 Pain in unspecified wrist: Secondary | ICD-10-CM

## 2011-07-27 DIAGNOSIS — M6740 Ganglion, unspecified site: Secondary | ICD-10-CM

## 2011-07-27 MED ORDER — TRAMADOL HCL 50 MG PO TABS: 50.0000 mg | ORAL_TABLET | Freq: Two times a day (BID) | ORAL | Status: DC | PRN

## 2011-07-27 MED ORDER — FEBUXOSTAT 80 MG PO TABS: 1.0000 | ORAL_TABLET | ORAL | Status: DC

## 2011-07-27 MED ORDER — COLCHICINE 0.6 MG PO TABS: 0.6000 mg | ORAL_TABLET | Freq: Four times a day (QID) | ORAL | Status: DC | PRN

## 2011-07-27 MED ORDER — PREDNISONE 10 MG PO TABS: ORAL_TABLET | ORAL | Status: DC

## 2011-07-28 ENCOUNTER — Ambulatory Visit (HOSPITAL_BASED_OUTPATIENT_CLINIC_OR_DEPARTMENT_OTHER)
Admission: RE | Admit: 2011-07-28 | Discharge: 2011-07-28 | Disposition: A | Discharge status: Home / Self Care | Payer: BC Managed Care – PPO | Source: Ambulatory Visit | Attending: Orthopedic Surgery | Admitting: Orthopedic Surgery

## 2011-07-28 ENCOUNTER — Encounter (HOSPITAL_BASED_OUTPATIENT_CLINIC_OR_DEPARTMENT_OTHER): Payer: Self-pay | Admitting: *Deleted

## 2011-07-28 ENCOUNTER — Encounter (HOSPITAL_BASED_OUTPATIENT_CLINIC_OR_DEPARTMENT_OTHER)
Admission: RE | Disposition: A | Discharge status: Home / Self Care | Payer: Self-pay | Source: Ambulatory Visit | Attending: Orthopedic Surgery

## 2011-07-28 ENCOUNTER — Encounter: Payer: Self-pay | Admitting: Internal Medicine

## 2011-07-28 ENCOUNTER — Other Ambulatory Visit: Payer: Self-pay | Admitting: Orthopedic Surgery

## 2011-07-28 ENCOUNTER — Ambulatory Visit (HOSPITAL_BASED_OUTPATIENT_CLINIC_OR_DEPARTMENT_OTHER): Payer: BC Managed Care – PPO | Admitting: *Deleted

## 2011-07-28 DIAGNOSIS — M1A9XX1 Chronic gout, unspecified, with tophus (tophi): Secondary | ICD-10-CM | POA: Insufficient documentation

## 2011-07-28 DIAGNOSIS — M25539 Pain in unspecified wrist: Secondary | ICD-10-CM | POA: Insufficient documentation

## 2011-07-28 DIAGNOSIS — M6740 Ganglion, unspecified site: Secondary | ICD-10-CM | POA: Insufficient documentation

## 2011-07-28 DIAGNOSIS — K219 Gastro-esophageal reflux disease without esophagitis: Secondary | ICD-10-CM | POA: Insufficient documentation

## 2011-07-28 DIAGNOSIS — M25561 Pain in right knee: Secondary | ICD-10-CM

## 2011-07-28 HISTORY — DX: Gastro-esophageal reflux disease without esophagitis: K21.9

## 2011-07-28 HISTORY — PX: MASS EXCISION: SHX2000

## 2011-07-28 HISTORY — DX: Unspecified osteoarthritis, unspecified site: M19.90

## 2011-07-28 LAB — POCT I-STAT, CHEM 8
Glucose, Bld: 93 mg/dL (ref 70–99)
HCT: 46 % (ref 39.0–52.0)
Hemoglobin: 15.6 g/dL (ref 13.0–17.0)
Potassium: 3.9 mEq/L (ref 3.5–5.1)
Sodium: 136 mEq/L (ref 135–145)

## 2011-07-28 SURGERY — EXCISION MASS
Anesthesia: Choice | Site: Foot | Laterality: Left | Wound class: Clean

## 2011-07-28 MED ORDER — ONDANSETRON HCL 4 MG/2ML IJ SOLN
INTRAMUSCULAR | Status: DC | PRN
  Administered 2011-07-28: 4 mg via INTRAVENOUS

## 2011-07-28 MED ORDER — PROPOFOL 10 MG/ML IV EMUL
INTRAVENOUS | Status: DC | PRN
  Administered 2011-07-28: 200 mg via INTRAVENOUS

## 2011-07-28 MED ORDER — MIDAZOLAM HCL 2 MG/2ML IJ SOLN
0.5000 mg | INTRAMUSCULAR | Status: DC | PRN
  Administered 2011-07-28: 2 mg via INTRAVENOUS

## 2011-07-28 MED ORDER — OXYMETAZOLINE HCL 0.05 % NA SOLN: 2.0000 | Freq: Once | NASAL | Status: DC

## 2011-07-28 MED ORDER — LACTATED RINGERS IV SOLN
INTRAVENOUS | Status: DC
  Administered 2011-07-28: 07:00:00 via INTRAVENOUS

## 2011-07-28 MED ORDER — ATROPINE SULFATE 0.4 MG/ML IJ SOLN: 0.4000 mg | Freq: Once | INTRAMUSCULAR | Status: DC | PRN

## 2011-07-28 MED ORDER — LIDOCAINE HCL 1 % IJ SOLN
INTRAMUSCULAR | Status: DC | PRN
  Administered 2011-07-28: 1 mL via INTRADERMAL

## 2011-07-28 MED ORDER — FENTANYL CITRATE 0.05 MG/ML IJ SOLN
50.0000 ug | INTRAMUSCULAR | Status: DC | PRN
  Administered 2011-07-28: 100 ug via INTRAVENOUS

## 2011-07-28 MED ORDER — LIDOCAINE-EPINEPHRINE 1.5-1:200000 % IJ SOLN
INTRAMUSCULAR | Status: DC | PRN
  Administered 2011-07-28: 20 mL via INTRADERMAL

## 2011-07-28 MED ORDER — LACTATED RINGERS IV SOLN: 500.0000 mL | INTRAVENOUS | Status: DC

## 2011-07-28 MED ORDER — MORPHINE SULFATE 2 MG/ML IJ SOLN: 0.0500 mg/kg | INTRAMUSCULAR | Status: DC | PRN

## 2011-07-28 MED ORDER — SENNOSIDES 8.6 MG PO TABS: 2.0000 | ORAL_TABLET | Freq: Two times a day (BID) | ORAL | Status: DC

## 2011-07-28 MED ORDER — ACETAMINOPHEN 325 MG PO TABS: 650.0000 mg | ORAL_TABLET | Freq: Four times a day (QID) | ORAL | Status: AC | PRN

## 2011-07-28 MED ORDER — KETOROLAC TROMETHAMINE 30 MG/ML IJ SOLN: 15.0000 mg | Freq: Once | INTRAMUSCULAR | Status: DC | PRN

## 2011-07-28 MED ORDER — IBUPROFEN 200 MG PO TABS: 200.0000 mg | ORAL_TABLET | Freq: Four times a day (QID) | ORAL | Status: DC | PRN

## 2011-07-28 MED ORDER — LIDOCAINE-PRILOCAINE 2.5-2.5 % EX CREA: 1.0000 "application " | TOPICAL_CREAM | Freq: Once | CUTANEOUS | Status: DC

## 2011-07-28 MED ORDER — OXYCODONE HCL 5 MG PO TABS: 5.0000 mg | ORAL_TABLET | ORAL | Status: AC | PRN

## 2011-07-28 MED ORDER — GLYCOPYRROLATE 0.2 MG/ML IJ SOLN: 0.2000 mg | Freq: Once | INTRAMUSCULAR | Status: DC | PRN

## 2011-07-28 MED ORDER — METOCLOPRAMIDE HCL 5 MG/ML IJ SOLN: 10.0000 mg | Freq: Once | INTRAMUSCULAR | Status: DC | PRN

## 2011-07-28 MED ORDER — DEXAMETHASONE SODIUM PHOSPHATE 4 MG/ML IJ SOLN
INTRAMUSCULAR | Status: DC | PRN
  Administered 2011-07-28: 10 mg via INTRAVENOUS

## 2011-07-28 MED ORDER — LACTATED RINGERS IV SOLN: INTRAVENOUS | Status: DC

## 2011-07-28 MED ORDER — DOCUSATE SODIUM 100 MG PO CAPS: 100.0000 mg | ORAL_CAPSULE | Freq: Two times a day (BID) | ORAL | Status: AC

## 2011-07-28 MED ORDER — MIDAZOLAM HCL 2 MG/2ML IJ SOLN: 1.0000 mg | INTRAMUSCULAR | Status: DC | PRN

## 2011-07-28 MED ORDER — CEFAZOLIN SODIUM-DEXTROSE 2-3 GM-% IV SOLR
2.0000 g | Freq: Once | INTRAVENOUS | Status: AC
  Administered 2011-07-28: 2 g via INTRAVENOUS

## 2011-07-28 MED ORDER — FENTANYL CITRATE 0.05 MG/ML IJ SOLN: 25.0000 ug | INTRAMUSCULAR | Status: DC | PRN

## 2011-07-28 MED ORDER — ROPIVACAINE HCL 5 MG/ML IJ SOLN
INTRAMUSCULAR | Status: DC | PRN
  Administered 2011-07-28: 20 mL via EPIDURAL

## 2011-07-28 SURGICAL SUPPLY — 56 items
BANDAGE COBAN STERILE 4 (GAUZE/BANDAGES/DRESSINGS) ×2 IMPLANT
BANDAGE CONFORM 2  STR LF (GAUZE/BANDAGES/DRESSINGS) IMPLANT
BANDAGE CONFORM 3  STR LF (GAUZE/BANDAGES/DRESSINGS) ×2 IMPLANT
BANDAGE ELASTIC 4 VELCRO ST LF (GAUZE/BANDAGES/DRESSINGS) IMPLANT
BANDAGE ESMARK 6X9 LF (GAUZE/BANDAGES/DRESSINGS) IMPLANT
BLADE MINI RND TIP GREEN BEAV (BLADE) IMPLANT
BLADE SURG 15 STRL LF DISP TIS (BLADE) ×2 IMPLANT
BLADE SURG 15 STRL SS (BLADE) ×2
BNDG COHESIVE 4X5 TAN STRL (GAUZE/BANDAGES/DRESSINGS) ×2 IMPLANT
BNDG ESMARK 4X9 LF (GAUZE/BANDAGES/DRESSINGS) ×2 IMPLANT
BNDG ESMARK 6X9 LF (GAUZE/BANDAGES/DRESSINGS)
CHLORAPREP W/TINT 26ML (MISCELLANEOUS) ×2 IMPLANT
CLOTH BEACON ORANGE TIMEOUT ST (SAFETY) ×2 IMPLANT
CORDS BIPOLAR (ELECTRODE) IMPLANT
COVER TABLE BACK 60X90 (DRAPES) ×2 IMPLANT
CUFF TOURNIQUET SINGLE 18IN (TOURNIQUET CUFF) IMPLANT
DRAPE EXTREMITY T 121X128X90 (DRAPE) ×2 IMPLANT
DRAPE SURG 17X23 STRL (DRAPES) ×2 IMPLANT
DRSG EMULSION OIL 3X3 NADH (GAUZE/BANDAGES/DRESSINGS) ×2 IMPLANT
DRSG PAD ABDOMINAL 8X10 ST (GAUZE/BANDAGES/DRESSINGS) ×2 IMPLANT
ELECT REM PT RETURN 9FT ADLT (ELECTROSURGICAL) ×2
ELECTRODE REM PT RTRN 9FT ADLT (ELECTROSURGICAL) ×1 IMPLANT
GLOVE BIO SURGEON STRL SZ8 (GLOVE) ×2 IMPLANT
GLOVE BIOGEL PI IND STRL 8 (GLOVE) ×1 IMPLANT
GLOVE BIOGEL PI INDICATOR 8 (GLOVE) ×1
GOWN PREVENTION PLUS XLARGE (GOWN DISPOSABLE) ×2 IMPLANT
GOWN STRL REIN XL XLG (GOWN DISPOSABLE) ×2 IMPLANT
NEEDLE HYPO 25X1 1.5 SAFETY (NEEDLE) IMPLANT
NS IRRIG 1000ML POUR BTL (IV SOLUTION) ×2 IMPLANT
PACK BASIN DAY SURGERY FS (CUSTOM PROCEDURE TRAY) ×2 IMPLANT
PAD CAST 4YDX4 CTTN HI CHSV (CAST SUPPLIES) ×1 IMPLANT
PADDING CAST ABS 4INX4YD NS (CAST SUPPLIES) ×1
PADDING CAST ABS COTTON 4X4 ST (CAST SUPPLIES) ×1 IMPLANT
PADDING CAST COTTON 4X4 STRL (CAST SUPPLIES) ×1
PADDING WEBRIL 4 STERILE (GAUZE/BANDAGES/DRESSINGS) ×2 IMPLANT
SHEET MEDIUM DRAPE 40X70 STRL (DRAPES) ×2 IMPLANT
SPONGE GAUZE 4X4 12PLY (GAUZE/BANDAGES/DRESSINGS) ×2 IMPLANT
SPONGE LAP 18X18 X RAY DECT (DISPOSABLE) ×2 IMPLANT
STOCKINETTE 4X48 STRL (DRAPES) IMPLANT
STOCKINETTE 6  STRL (DRAPES)
STOCKINETTE 6 STRL (DRAPES) IMPLANT
STRIP CLOSURE SKIN 1/2X4 (GAUZE/BANDAGES/DRESSINGS) ×2 IMPLANT
SUCTION FRAZIER TIP 10 FR DISP (SUCTIONS) IMPLANT
SUT ETHILON 4 0 PS 2 18 (SUTURE) ×2 IMPLANT
SUT MNCRL AB 4-0 PS2 18 (SUTURE) ×2 IMPLANT
SUT VIC AB 2-0 SH 18 (SUTURE) IMPLANT
SUT VIC AB 3-0 PS1 18 (SUTURE)
SUT VIC AB 3-0 PS1 18XBRD (SUTURE) IMPLANT
SUT VICRYL 4-0 PS2 18IN ABS (SUTURE) IMPLANT
SYR BULB 3OZ (MISCELLANEOUS) ×2 IMPLANT
SYR CONTROL 10ML LL (SYRINGE) IMPLANT
TOWEL OR 17X24 6PK STRL BLUE (TOWEL DISPOSABLE) ×2 IMPLANT
TUBE CONNECTING 20X1/4 (TUBING) IMPLANT
UNDERPAD 30X30 INCONTINENT (UNDERPADS AND DIAPERS) ×2 IMPLANT
WATER STERILE IRR 1000ML POUR (IV SOLUTION) IMPLANT
YANKAUER SUCT BULB TIP NO VENT (SUCTIONS) IMPLANT

## 2011-07-28 NOTE — Transfer of Care (Signed)
Immediate Anesthesia Transfer of Care Note  Patient: George Fitzgerald  Procedure(s) Performed:  EXCISION MASS - left foot ganglion cyst excision  Patient Location: PACU  Anesthesia Type: GA combined with regional for post-op pain  Level of Consciousness: awake, alert , oriented and patient cooperative  Airway & Oxygen Therapy: Patient Spontanous Breathing and Patient connected to face mask oxygen  Post-op Assessment: Report given to PACU RN, Post -op Vital signs reviewed and stable and Patient moving all extremities X 4  Post vital signs: Reviewed and stable  Complications: No apparent anesthesia complications

## 2011-07-28 NOTE — Assessment & Plan Note (Addendum)
See new meds Splint  Potential benefits of a short term steroid  use as well as potential risks  and complications were explained to the patient and were aknowledged.

## 2011-07-28 NOTE — Op Note (Signed)
NAME:  George Fitzgerald, George Fitzgerald                      ACCOUNT NO.:  MEDICAL RECORD NO.:  [id number]  LOCATION:                                 FACILITY:  PHYSICIAN:  Toni Arthurs, MD             DATE OF BIRTH:  DATE OF PROCEDURE:  07/28/2011 DATE OF DISCHARGE:                              OPERATIVE REPORT   PREOPERATIVE DIAGNOSIS:  Left foot ganglion cyst.  POSTOPERATIVE DIAGNOSIS:  Left foot ganglion cyst.  PROCEDURE:  Excision of left foot ganglion cyst.  SURGEON:  Toni Arthurs, MD  ANESTHESIA:  General, regional.  INTRAVENOUS FLUIDS:  See anesthesia record.  ESTIMATED BLOOD LOSS:  Minimal.  TOURNIQUET TIME:  Approximately 15 minutes with an ankle Esmarch.  COMPLICATIONS:  None apparent.  SPECIMEN:  Left foot mass to Pathology.  DISPOSITION:  Extubated, awake, and stable to recovery.  INDICATIONS FOR PROCEDURE:  The patient is a 60 year old male with a past medical history significant for gout who complains of left foot mass for several months.  This is painful with shoe wear.  He desires excision.  He understands risks and benefits with alternative treatment options and would like to proceed.  Specifically he understands risks of bleeding, infection, nerve damage, blood clots, need for additional surgery, amputation, and death.  PROCEDURE IN DETAIL:  After preoperative consent was obtained, the correct operative site was identified.  The patient was brought to the operating room and placed supine on the operating table.  General anesthesia was induced.  Preoperative antibiotics were administered. Surgical time-out was taken.  Left lower extremity was prepped and draped in standard sterile fashion.  Longitudinal incision was marked over the mass on the left foot.  The extremity was exsanguinated and a 4- inch Esmarch tourniquet was wrapped around the ankle.  The previously marked incision was made and sharp dissection was carried down through the skin.  Blunt dissection was then  carried around the mass freeing it from the surrounding superficial and subcutaneous tissues.  It was excised in its entirety from its base.  This was overlying the fourth and fifth tarsometatarsal joints.  The mass had clear gelatinous fluid as well as toothpaste consistency white chalky material.  This was consistent with a ganglion cyst as well as the patient's history of gouty arthropathy.  The tophaceous material was then excised with a curette and rongeur.  The wound was irrigated copiously.  Inverted simple sutures of 3-0 Monocryl were used to close the subcutaneous tissue.  The skin incision was closed with 3-0 Prolene horizontal mattress sutures.  Sterile dressings were applied followed by compression wrap.  The tourniquet was released.  The patient was then awakened from anesthesia and transported to the recovery room in stable condition.  PLAN:  The patient will be weightbearing as tolerated on his left foot in a hard sole shoe.  He will follow up with me in 2 weeks for suture removal.     Toni Arthurs, MD     JH/MEDQ  D:  07/28/2011  T:  07/28/2011  Job:  [id number]

## 2011-07-28 NOTE — Anesthesia Procedure Notes (Addendum)
  Narrative:    Anesthesia Regional Block:  Popliteal block  Pre-Anesthetic Checklist: ,, timeout performed, Correct Patient, Correct Site, Correct Laterality, Correct Procedure, Correct Position, site marked, Risks and benefits discussed,  Surgical consent,  Pre-op evaluation,  At surgeon's request and post-op pain management  Laterality: Left  Prep: chloraprep       Needles:   Needle Type: Other   (Arrow Echogenic)   Needle Length: 9cm  Needle Gauge: 21    Additional Needles:  Procedures: ultrasound guided Popliteal block Narrative:  Start time: 07/28/2011 7:12 AM End time: 07/28/2011 7:22 AM Injection made incrementally with aspirations every 5 mL.  Performed by: Personally  Anesthesiologist: [email or web address]  Additional Notes: Ultrasound guidance used to: id relevant anatomy, confirm needle position, local anesthetic spread, avoidance of vascular puncture. Picture saved. No complications.    Popliteal block Anesthesia Regional Block:  Popliteal block  Pre-Anesthetic Checklist: ,, timeout performed, Correct Patient, Correct Site, Correct Laterality, Correct Procedure, Correct Position, site marked, Risks and benefits discussed,  Surgical consent,  Pre-op evaluation,  At surgeon's request and post-op pain management   Prep: chloraprep       Needles:   Needle Type: Other   (Arrow Echogenic)   Needle Length: 9cm  Needle Gauge: 21    Additional Needles:  Procedures: ultrasound guided Popliteal block Narrative:  Injection made incrementally with aspirations every 5 mL.  Performed by: Personally   Additional Notes: Ultrasound guidance used to: id relevant anatomy, confirm needle position, local anesthetic spread, avoidance of vascular puncture. Picture saved. No complications.    Popliteal block Performed by: Meyer Russel    Procedure Name: LMA Insertion Date/Time: 07/28/2011 7:44 AM Performed by: Meyer Russel Pre-anesthesia Checklist: Patient  identified, Timeout performed, Emergency Drugs available, Suction available and Patient being monitored Patient Re-evaluated:Patient Re-evaluated prior to inductionOxygen Delivery Method: Circle System Utilized Preoxygenation: Pre-oxygenation with 100% oxygen Intubation Type: IV induction Ventilation: Mask ventilation without difficulty LMA: LMA inserted LMA Size: 5.0 Number of attempts: 1 Placement Confirmation: positive ETCO2 and breath sounds checked- equal and bilateral Tube secured with: Tape Dental Injury: Teeth and Oropharynx as per pre-operative assessment

## 2011-07-28 NOTE — Brief Op Note (Signed)
07/28/2011  8:15 AM  PATIENT:  George Fitzgerald  60 y.o. male  PRE-OPERATIVE DIAGNOSIS:  left foot ganglion  POST-OPERATIVE DIAGNOSIS:  Left foot ganglion PROCEDURE:  Procedure(s): EXCISION MASS  SURGEON:  Surgeon(s): Toni Arthurs, MD  PHYSICIAN ASSISTANT:   ASSISTANTS: none   ANESTHESIA:   regional and general  EBL:     BLOOD ADMINISTERED:none  DRAINS: none   LOCAL MEDICATIONS USED:  NONE  SPECIMEN:  Source of Specimen:  left foot mass  DISPOSITION OF SPECIMEN:  PATHOLOGY  COUNTS:  YES  TOURNIQUET:  * No tourniquets in log *  DICTATION: .Other Dictation: Dictation Number [id number]  PLAN OF CARE: Discharge to home after PACU  PATIENT DISPOSITION:  PACU - hemodynamically stable.   Delay start of Pharmacological VTE agent (>24hrs) due to surgical blood loss or risk of bleeding:  not applicable

## 2011-07-28 NOTE — Assessment & Plan Note (Signed)
Short term Pred taper and Colchicine Increase Uloric dose later

## 2011-07-28 NOTE — Progress Notes (Signed)
  Subjective:    Patient ID: George Fitzgerald, male    DOB: May 11, 1951, 60 y.o.   MRN: [id number]  HPI  C/o severe pain and swelling of his R wrist x 2 d -- after he stopped mobic for upcoming foot ganglion surgery. There was no injury. No redness.   Review of Systems  Constitutional: Negative for appetite change, fatigue and unexpected weight change.  HENT: Negative for nosebleeds, congestion, sore throat, sneezing, trouble swallowing and neck pain.   Eyes: Negative for itching and visual disturbance.  Respiratory: Negative for cough.   Cardiovascular: Negative for chest pain, palpitations and leg swelling.  Gastrointestinal: Negative for nausea, diarrhea, blood in stool and abdominal distention.  Genitourinary: Negative for frequency and hematuria.  Musculoskeletal: Positive for joint swelling (occ R knee) and arthralgias. Negative for back pain and gait problem.  Skin: Negative for rash.  Neurological: Negative for dizziness, tremors, speech difficulty and weakness.  Psychiatric/Behavioral: Negative for sleep disturbance, dysphoric mood and agitation. The patient is not nervous/anxious.        Objective:   Physical Exam  Constitutional: He is oriented to person, place, and time. He appears well-developed.  HENT:  Mouth/Throat: Oropharynx is clear and moist.  Eyes: Conjunctivae are normal. Pupils are equal, round, and reactive to light.  Neck: Normal range of motion. No JVD present. No thyromegaly present.  Cardiovascular: Normal rate, regular rhythm, normal heart sounds and intact distal pulses.  Exam reveals no gallop and no friction rub.   No murmur heard. Pulmonary/Chest: Effort normal and breath sounds normal. No respiratory distress. He has no wheezes. He has no rales. He exhibits no tenderness.  Abdominal: Soft. Bowel sounds are normal. He exhibits no distension and no mass. There is no tenderness. There is no rebound and no guarding.  Musculoskeletal: Normal range of motion. He  exhibits edema and tenderness.       R wrist is tender and swollen  Lymphadenopathy:    He has no cervical adenopathy.  Neurological: He is alert and oriented to person, place, and time. He has normal reflexes. No cranial nerve deficit. He exhibits normal muscle tone. Coordination normal.  Skin: Skin is warm and dry. No rash noted.  Psychiatric: He has a normal mood and affect. His behavior is normal. Judgment and thought content normal.      Knee fluid with gouty crystals    Assessment & Plan:

## 2011-07-28 NOTE — Anesthesia Preprocedure Evaluation (Addendum)
Anesthesia Evaluation  Patient identified by MRN, date of birth, ID band Patient awake    Reviewed: Allergy & Precautions, H&P , NPO status , Patient's Chart, lab work & pertinent test results, reviewed documented beta blocker date and time   Airway Mallampati: II TM Distance: >3 FB Neck ROM: full    Dental No notable dental hx.    Pulmonary neg pulmonary ROS,    Pulmonary exam normal       Cardiovascular neg cardio ROS     Neuro/Psych Negative Neurological ROS  Negative Psych ROS   GI/Hepatic Neg liver ROS, GERD-  Medicated and Controlled,  Endo/Other  Negative Endocrine ROS  Renal/GU negative Renal ROS  Genitourinary negative   Musculoskeletal   Abdominal   Peds  Hematology negative hematology ROS (+)   Anesthesia Other Findings See surgeon's H&P   Reproductive/Obstetrics negative OB ROS                           Anesthesia Physical Anesthesia Plan  ASA: II  Anesthesia Plan: General   Post-op Pain Management: MAC Combined w/ Regional for Post-op pain   Induction: Intravenous  Airway Management Planned: LMA  Additional Equipment:   Intra-op Plan:   Post-operative Plan:   Informed Consent: I have reviewed the patients History and Physical, chart, labs and discussed the procedure including the risks, benefits and alternatives for the proposed anesthesia with the patient or authorized representative who has indicated his/her understanding and acceptance.   Dental Advisory Given  Plan Discussed with: CRNA and Surgeon  Anesthesia Plan Comments:       Anesthesia Quick Evaluation

## 2011-07-28 NOTE — Anesthesia Postprocedure Evaluation (Signed)
  Anesthesia Post-op Note  Patient: George Fitzgerald  Procedure(s) Performed:  EXCISION MASS - left foot ganglion cyst excision  Patient Location: PACU  Anesthesia Type: GA combined with regional for post-op pain  Level of Consciousness: awake and alert   Airway and Oxygen Therapy: Patient Spontanous Breathing  Post-op Pain: none  Post-op Assessment: Post-op Vital signs reviewed, Patient's Cardiovascular Status Stable, Respiratory Function Stable, Patent Airway, No signs of Nausea or vomiting, Adequate PO intake and Pain level controlled  Post-op Vital Signs: Reviewed and stable  Complications: No apparent anesthesia complications

## 2011-07-28 NOTE — Assessment & Plan Note (Signed)
Surgery on 11/8

## 2011-07-28 NOTE — H&P (Signed)
Cc:  L foot mass HPI:  60 y/o male with mass on L foot for many months.  Painful with shoewear.  Pt desires removal.  PMH:  GERD SH:  No tobacco. FH:  noncontrib PE:  wn wd male in nad.  A and o x 4.  Mood and affect normal.  Cardiac and pulm exam normal. EOMI.  L foot with 2 cm mass at dorsal lateral surface.  2+ dp and pt pulses.  Skin healhty and intact.  5/5 strength at L LE.  A:  L foot mass  P:  Pt desires removal of the mass due to pain with shoe wear and activitiy.  He understands the risks and benefits fo the alternative treatment options and would like to proceed with surgical excision.  He specifically understands risks of bleeding, infection, nerve damage, blood clots, amputation and death.

## 2011-08-01 ENCOUNTER — Encounter (HOSPITAL_BASED_OUTPATIENT_CLINIC_OR_DEPARTMENT_OTHER): Payer: Self-pay | Admitting: Orthopedic Surgery

## 2011-09-05 ENCOUNTER — Other Ambulatory Visit (INDEPENDENT_AMBULATORY_CARE_PROVIDER_SITE_OTHER): Payer: BC Managed Care – PPO

## 2011-09-05 DIAGNOSIS — M109 Gout, unspecified: Secondary | ICD-10-CM

## 2011-09-05 LAB — HEPATIC FUNCTION PANEL
ALT: 31 U/L (ref 0–53)
AST: 26 U/L (ref 0–37)
Albumin: 4.3 g/dL (ref 3.5–5.2)
Alkaline Phosphatase: 90 U/L (ref 39–117)
Bilirubin, Direct: 0.1 mg/dL (ref 0.0–0.3)
Total Bilirubin: 0.6 mg/dL (ref 0.3–1.2)
Total Protein: 7.2 g/dL (ref 6.0–8.3)

## 2011-09-05 LAB — BASIC METABOLIC PANEL
CO2: 27 mEq/L (ref 19–32)
Calcium: 9.1 mg/dL (ref 8.4–10.5)
Glucose, Bld: 120 mg/dL — ABNORMAL HIGH (ref 70–99)
Potassium: 4.7 mEq/L (ref 3.5–5.1)
Sodium: 140 mEq/L (ref 135–145)

## 2011-09-07 ENCOUNTER — Ambulatory Visit (INDEPENDENT_AMBULATORY_CARE_PROVIDER_SITE_OTHER): Payer: BC Managed Care – PPO | Admitting: Internal Medicine

## 2011-09-07 ENCOUNTER — Encounter: Payer: Self-pay | Admitting: Internal Medicine

## 2011-09-07 VITALS — BP 142/80 | HR 76 | Temp 97.9°F | Resp 16 | Wt 191.0 lb

## 2011-09-07 DIAGNOSIS — N320 Bladder-neck obstruction: Secondary | ICD-10-CM

## 2011-09-07 DIAGNOSIS — M109 Gout, unspecified: Secondary | ICD-10-CM

## 2011-09-07 DIAGNOSIS — R7309 Other abnormal glucose: Secondary | ICD-10-CM

## 2011-09-07 DIAGNOSIS — E785 Hyperlipidemia, unspecified: Secondary | ICD-10-CM

## 2011-09-07 NOTE — Assessment & Plan Note (Signed)
On Lipitor 

## 2011-09-07 NOTE — Assessment & Plan Note (Signed)
Continue with current prescription therapy as reflected on the Med list.  

## 2011-09-07 NOTE — Assessment & Plan Note (Signed)
A1c

## 2011-09-11 NOTE — Progress Notes (Signed)
  Subjective:    Patient ID: George Fitzgerald, male    DOB: 17-Jul-1951, 60 y.o.   MRN: [id number]  HPI  F/u gout,dyslipidemia, elev. glucose  Review of Systems  Constitutional: Negative for appetite change, fatigue and unexpected weight change.  HENT: Negative for nosebleeds, congestion, sore throat, sneezing, trouble swallowing and neck pain.   Eyes: Negative for itching and visual disturbance.  Respiratory: Negative for cough.   Cardiovascular: Negative for chest pain, palpitations and leg swelling.  Gastrointestinal: Negative for nausea, diarrhea, blood in stool and abdominal distention.  Genitourinary: Negative for frequency and hematuria.  Musculoskeletal: Positive for arthralgias. Negative for back pain, joint swelling and gait problem.  Skin: Negative for rash.  Neurological: Negative for dizziness, tremors, speech difficulty and weakness.  Psychiatric/Behavioral: Negative for sleep disturbance, dysphoric mood and agitation. The patient is not nervous/anxious.        Objective:   Physical Exam  Constitutional: He is oriented to person, place, and time. He appears well-developed.  HENT:  Mouth/Throat: Oropharynx is clear and moist.  Eyes: Conjunctivae are normal. Pupils are equal, round, and reactive to light.  Neck: Normal range of motion. No JVD present. No thyromegaly present.  Cardiovascular: Normal rate, regular rhythm, normal heart sounds and intact distal pulses.  Exam reveals no gallop and no friction rub.   No murmur heard. Pulmonary/Chest: Effort normal and breath sounds normal. No respiratory distress. He has no wheezes. He has no rales. He exhibits no tenderness.  Abdominal: Soft. Bowel sounds are normal. He exhibits no distension and no mass. There is no tenderness. There is no rebound and no guarding.  Musculoskeletal: Normal range of motion. He exhibits no edema and no tenderness.  Lymphadenopathy:    He has no cervical adenopathy.  Neurological: He is alert and  oriented to person, place, and time. He has normal reflexes. No cranial nerve deficit. He exhibits normal muscle tone. Coordination normal.  Skin: Skin is warm and dry. No rash noted.  Psychiatric: He has a normal mood and affect. His behavior is normal. Judgment and thought content normal.   Lab Results  Component Value Date   WBC 6.9 06/02/2011   HGB 15.6 07/28/2011   HCT 46.0 07/28/2011   PLT 231.0 06/02/2011   GLUCOSE 120* 09/05/2011   CHOL 258* 06/27/2011   TRIG 85.0 06/27/2011   HDL 72.00 06/27/2011   LDLDIRECT 174.6 06/27/2011   ALT 31 09/05/2011   AST 26 09/05/2011   NA 140 09/05/2011   K 4.7 09/05/2011   CL 106 09/05/2011   CREATININE 1.1 09/05/2011   BUN 19 09/05/2011   CO2 27 09/05/2011   TSH 3.94 02/16/2011   PSA 0.53 02/16/2011          Assessment & Plan:

## 2011-10-31 ENCOUNTER — Other Ambulatory Visit: Payer: Self-pay | Admitting: *Deleted

## 2011-10-31 MED ORDER — ATORVASTATIN CALCIUM 10 MG PO TABS: 10.0000 mg | ORAL_TABLET | Freq: Every day | ORAL | Status: DC

## 2012-01-14 ENCOUNTER — Other Ambulatory Visit: Payer: Self-pay | Admitting: Internal Medicine

## 2012-03-07 ENCOUNTER — Ambulatory Visit: Payer: BC Managed Care – PPO | Admitting: Internal Medicine

## 2012-03-09 ENCOUNTER — Other Ambulatory Visit: Payer: Self-pay | Admitting: Internal Medicine

## 2012-03-12 ENCOUNTER — Telehealth: Payer: Self-pay | Admitting: *Deleted

## 2012-03-12 NOTE — Telephone Encounter (Signed)
Ok to RF? 

## 2012-03-12 NOTE — Telephone Encounter (Signed)
Rf req for Tramadol 50 mg 1-2 po bid prn. # 60 ok to Rf?

## 2012-03-12 NOTE — Telephone Encounter (Signed)
OK to fill this prescription with additional refills x0 OV w/labs Thank you!

## 2012-03-13 MED ORDER — TRAMADOL HCL 50 MG PO TABS: 50.0000 mg | ORAL_TABLET | Freq: Two times a day (BID) | ORAL | Status: DC | PRN

## 2012-03-13 NOTE — Telephone Encounter (Signed)
Done- pt is scheduled 03/19/12.

## 2012-03-15 ENCOUNTER — Other Ambulatory Visit (INDEPENDENT_AMBULATORY_CARE_PROVIDER_SITE_OTHER): Payer: BC Managed Care – PPO

## 2012-03-15 ENCOUNTER — Telehealth: Payer: Self-pay | Admitting: *Deleted

## 2012-03-15 ENCOUNTER — Other Ambulatory Visit: Payer: Self-pay | Admitting: *Deleted

## 2012-03-15 DIAGNOSIS — E875 Hyperkalemia: Secondary | ICD-10-CM

## 2012-03-15 DIAGNOSIS — E785 Hyperlipidemia, unspecified: Secondary | ICD-10-CM

## 2012-03-15 DIAGNOSIS — M109 Gout, unspecified: Secondary | ICD-10-CM

## 2012-03-15 DIAGNOSIS — R7309 Other abnormal glucose: Secondary | ICD-10-CM

## 2012-03-15 DIAGNOSIS — N320 Bladder-neck obstruction: Secondary | ICD-10-CM

## 2012-03-15 LAB — BASIC METABOLIC PANEL
Chloride: 109 mEq/L (ref 96–112)
Creatinine, Ser: 0.9 mg/dL (ref 0.4–1.5)
GFR: 87.2 mL/min (ref >60.00)

## 2012-03-15 LAB — URINALYSIS
Nitrite: NEGATIVE
Specific Gravity, Urine: 1.025 (ref 1.000–1.030)
Total Protein, Urine: NEGATIVE
Urine Glucose: NEGATIVE

## 2012-03-15 LAB — CBC WITH DIFFERENTIAL/PLATELET
Basophils Relative: 1.1 % (ref 0.0–3.0)
Eosinophils Relative: 2.8 % (ref 0.0–5.0)
Lymphocytes Relative: 25.5 % (ref 12.0–46.0)
MCV: 98.5 fl (ref 78.0–100.0)
Monocytes Absolute: 0.5 10*3/uL (ref 0.1–1.0)
Monocytes Relative: 10.6 % (ref 3.0–12.0)
Neutrophils Relative %: 60 % (ref 43.0–77.0)
Platelets: 194 10*3/uL (ref 150.0–400.0)
RBC: 3.98 Mil/uL — ABNORMAL LOW (ref 4.22–5.81)
WBC: 4.4 10*3/uL — ABNORMAL LOW (ref 4.5–10.5)

## 2012-03-15 LAB — HEPATIC FUNCTION PANEL
ALT: 32 U/L (ref 0–53)
Albumin: 4.2 g/dL (ref 3.5–5.2)
Alkaline Phosphatase: 76 U/L (ref 39–117)
Total Protein: 6.6 g/dL (ref 6.0–8.3)

## 2012-03-15 LAB — LIPID PANEL
Cholesterol: 228 mg/dL — ABNORMAL HIGH (ref 0–200)
HDL: 72.6 mg/dL (ref >39.00)
VLDL: 18.6 mg/dL (ref 0.0–40.0)

## 2012-03-15 LAB — LDL CHOLESTEROL, DIRECT: Direct LDL: 131.6 mg/dL

## 2012-03-15 LAB — HEMOGLOBIN A1C: Hgb A1c MFr Bld: 5.8 % (ref 4.6–6.5)

## 2012-03-15 LAB — PSA: PSA: 0.5 ng/mL (ref 0.10–4.00)

## 2012-03-15 NOTE — Telephone Encounter (Signed)
Pt's Kcl is elevated at 6.2. I advised pt per Dr. Posey Rea to hold all potassium rich foods and supplements and come back to our lab tomorrow for recheck.

## 2012-03-16 ENCOUNTER — Other Ambulatory Visit (INDEPENDENT_AMBULATORY_CARE_PROVIDER_SITE_OTHER): Payer: BC Managed Care – PPO

## 2012-03-16 ENCOUNTER — Telehealth: Payer: Self-pay | Admitting: Internal Medicine

## 2012-03-16 DIAGNOSIS — E875 Hyperkalemia: Secondary | ICD-10-CM

## 2012-03-16 LAB — POTASSIUM: Potassium: 5.2 mEq/L — ABNORMAL HIGH (ref 3.5–5.1)

## 2012-03-16 NOTE — Telephone Encounter (Signed)
George Fitzgerald, please, inform patient that his K is better - a little up No bananas Keep ROV Thx

## 2012-03-16 NOTE — Telephone Encounter (Signed)
Pt informed

## 2012-03-19 ENCOUNTER — Ambulatory Visit (INDEPENDENT_AMBULATORY_CARE_PROVIDER_SITE_OTHER): Payer: BC Managed Care – PPO | Admitting: Internal Medicine

## 2012-03-19 ENCOUNTER — Encounter: Payer: Self-pay | Admitting: Internal Medicine

## 2012-03-19 VITALS — BP 150/100 | HR 80 | Temp 97.8°F | Resp 16 | Wt 184.0 lb

## 2012-03-19 DIAGNOSIS — E785 Hyperlipidemia, unspecified: Secondary | ICD-10-CM

## 2012-03-19 DIAGNOSIS — R030 Elevated blood-pressure reading, without diagnosis of hypertension: Secondary | ICD-10-CM

## 2012-03-19 DIAGNOSIS — F4321 Adjustment disorder with depressed mood: Secondary | ICD-10-CM | POA: Insufficient documentation

## 2012-03-19 DIAGNOSIS — E875 Hyperkalemia: Secondary | ICD-10-CM

## 2012-03-19 DIAGNOSIS — R7309 Other abnormal glucose: Secondary | ICD-10-CM

## 2012-03-19 MED ORDER — FLUTICASONE PROPIONATE 50 MCG/ACT NA SUSP: 2.0000 | Freq: Every day | NASAL | Status: DC

## 2012-03-19 NOTE — Patient Instructions (Signed)
Normal BP<130/85 

## 2012-03-19 NOTE — Assessment & Plan Note (Signed)
Will watch 

## 2012-03-19 NOTE — Assessment & Plan Note (Signed)
Continue with current prescription therapy as reflected on the Med list.  

## 2012-03-19 NOTE — Progress Notes (Signed)
  Subjective:    Patient ID: George Fitzgerald, male    DOB: June 27, 1951, 61 y.o.   MRN: [id number]  HPI  F/u gout,dyslipidemia, elev. glucose He had elev K lately  BP Readings from Last 3 Encounters:  03/19/12 150/100  09/07/11 142/80  07/28/11 128/68   Wt Readings from Last 3 Encounters:  03/19/12 184 lb (83.462 kg)  09/07/11 191 lb (86.637 kg)  07/25/11 195 lb (88.451 kg)      Review of Systems  Constitutional: Negative for appetite change, fatigue and unexpected weight change.  HENT: Negative for nosebleeds, congestion, sore throat, sneezing, trouble swallowing and neck pain.   Eyes: Negative for itching and visual disturbance.  Respiratory: Negative for cough.   Cardiovascular: Negative for chest pain, palpitations and leg swelling.  Gastrointestinal: Negative for nausea, diarrhea, blood in stool and abdominal distention.  Genitourinary: Negative for frequency and hematuria.  Musculoskeletal: Positive for arthralgias. Negative for back pain, joint swelling and gait problem.  Skin: Negative for rash.  Neurological: Negative for dizziness, tremors, speech difficulty and weakness.  Psychiatric/Behavioral: Negative for disturbed wake/sleep cycle, dysphoric mood and agitation. The patient is not nervous/anxious.        Objective:   Physical Exam  Constitutional: He is oriented to person, place, and time. He appears well-developed.  HENT:  Mouth/Throat: Oropharynx is clear and moist.  Eyes: Conjunctivae are normal. Pupils are equal, round, and reactive to light.  Neck: Normal range of motion. No JVD present. No thyromegaly present.  Cardiovascular: Normal rate, regular rhythm, normal heart sounds and intact distal pulses.  Exam reveals no gallop and no friction rub.   No murmur heard. Pulmonary/Chest: Effort normal and breath sounds normal. No respiratory distress. He has no wheezes. He has no rales. He exhibits no tenderness.  Abdominal: Soft. Bowel sounds are normal. He exhibits  no distension and no mass. There is no tenderness. There is no rebound and no guarding.  Musculoskeletal: Normal range of motion. He exhibits no edema and no tenderness.  Lymphadenopathy:    He has no cervical adenopathy.  Neurological: He is alert and oriented to person, place, and time. He has normal reflexes. No cranial nerve deficit. He exhibits normal muscle tone. Coordination normal.  Skin: Skin is warm and dry. No rash noted.  Psychiatric: He has a normal mood and affect. His behavior is normal. Judgment and thought content normal.   Lab Results  Component Value Date   WBC 4.4* 03/15/2012   HGB 13.4 03/15/2012   HCT 39.2 03/15/2012   PLT 194.0 03/15/2012   GLUCOSE 127* 03/15/2012   CHOL 228* 03/15/2012   TRIG 93.0 03/15/2012   HDL 72.60 03/15/2012   LDLDIRECT 131.6 03/15/2012   ALT 32 03/15/2012   AST 24 03/15/2012   NA 142 03/15/2012   K 5.2* 03/16/2012   CL 109 03/15/2012   CREATININE 0.9 03/15/2012   BUN 28* 03/15/2012   CO2 26 03/15/2012   TSH 4.81 03/15/2012   PSA 0.50 03/15/2012   HGBA1C 5.8 03/15/2012          Assessment & Plan:

## 2012-03-19 NOTE — Assessment & Plan Note (Addendum)
Start Rx if elevated

## 2012-06-25 ENCOUNTER — Ambulatory Visit: Payer: BC Managed Care – PPO | Admitting: Internal Medicine

## 2012-06-25 DIAGNOSIS — Z0289 Encounter for other administrative examinations: Secondary | ICD-10-CM

## 2012-07-16 ENCOUNTER — Other Ambulatory Visit: Payer: Self-pay | Admitting: Internal Medicine

## 2012-07-16 NOTE — Telephone Encounter (Signed)
Last written 03/13/2012 #60 with 0 refill-please advise.

## 2012-08-09 ENCOUNTER — Other Ambulatory Visit: Payer: Self-pay | Admitting: Internal Medicine

## 2012-08-13 ENCOUNTER — Other Ambulatory Visit: Payer: Self-pay | Admitting: *Deleted

## 2012-08-13 MED ORDER — FEBUXOSTAT 80 MG PO TABS: 1.0000 | ORAL_TABLET | Freq: Every day | ORAL | Status: DC

## 2012-09-21 ENCOUNTER — Ambulatory Visit (INDEPENDENT_AMBULATORY_CARE_PROVIDER_SITE_OTHER): Payer: BC Managed Care – PPO | Admitting: Internal Medicine

## 2012-09-21 ENCOUNTER — Encounter: Payer: Self-pay | Admitting: Internal Medicine

## 2012-09-21 VITALS — BP 180/100 | HR 80 | Temp 97.0°F | Resp 16 | Wt 198.0 lb

## 2012-09-21 DIAGNOSIS — R7309 Other abnormal glucose: Secondary | ICD-10-CM

## 2012-09-21 DIAGNOSIS — E785 Hyperlipidemia, unspecified: Secondary | ICD-10-CM

## 2012-09-21 DIAGNOSIS — K921 Melena: Secondary | ICD-10-CM

## 2012-09-21 DIAGNOSIS — M109 Gout, unspecified: Secondary | ICD-10-CM

## 2012-09-21 DIAGNOSIS — K649 Unspecified hemorrhoids: Secondary | ICD-10-CM

## 2012-09-21 MED ORDER — HYDROCORTISONE ACETATE 25 MG RE SUPP: 25.0000 mg | Freq: Two times a day (BID) | RECTAL | Status: DC

## 2012-09-21 MED ORDER — COLCHICINE 0.6 MG PO TABS: 0.6000 mg | ORAL_TABLET | Freq: Four times a day (QID) | ORAL | Status: DC | PRN

## 2012-09-21 NOTE — Progress Notes (Signed)
Subjective:    Rectal Bleeding  The current episode started more than 1 week ago. The problem occurs frequently. The problem has been gradually improving. The patient is experiencing no pain. The stool is described as hard. Prior successful therapies include fiber. Pertinent negatives include no diarrhea, no hemorrhoids, no nausea, no hematuria, no chest pain, no coughing and no rash. He has been eating and drinking normally.    Nl colon 11/2008  F/u OA, gout, dyslipidemia  BP Readings from Last 3 Encounters:  09/21/12 180/100  03/19/12 150/100  09/07/11 142/80   Wt Readings from Last 3 Encounters:  09/21/12 198 lb (89.812 kg)  03/19/12 184 lb (83.462 kg)  09/07/11 191 lb (86.637 kg)      Review of Systems  Constitutional: Negative for appetite change, fatigue and unexpected weight change.  HENT: Negative for nosebleeds, congestion, sore throat, sneezing, trouble swallowing and neck pain.   Eyes: Negative for itching and visual disturbance.  Respiratory: Negative for cough.   Cardiovascular: Negative for chest pain, palpitations and leg swelling.  Gastrointestinal: Positive for constipation, blood in stool and hematochezia. Negative for nausea, diarrhea, abdominal distention and hemorrhoids.  Genitourinary: Negative for frequency and hematuria.  Musculoskeletal: Positive for back pain and arthralgias. Negative for joint swelling and gait problem.  Skin: Negative for rash.  Neurological: Negative for dizziness, tremors, speech difficulty and weakness.  Psychiatric/Behavioral: Negative for sleep disturbance, dysphoric mood and agitation. The patient is not nervous/anxious.        Objective:   Physical Exam  Constitutional: He is oriented to person, place, and time. He appears well-developed.  HENT:  Mouth/Throat: Oropharynx is clear and moist.  Eyes: Conjunctivae normal are normal. Pupils are equal, round, and reactive to light.  Neck: Normal range of motion. No JVD  present. No thyromegaly present.  Cardiovascular: Normal rate, regular rhythm, normal heart sounds and intact distal pulses.  Exam reveals no gallop and no friction rub.   No murmur heard. Pulmonary/Chest: Effort normal and breath sounds normal. No respiratory distress. He has no wheezes. He has no rales. He exhibits no tenderness.  Abdominal: Soft. Bowel sounds are normal. He exhibits no distension and no mass. There is no tenderness. There is no rebound and no guarding.  Musculoskeletal: Normal range of motion. He exhibits no edema and no tenderness.  Lymphadenopathy:    He has no cervical adenopathy.  Neurological: He is alert and oriented to person, place, and time. He has normal reflexes. No cranial nerve deficit. He exhibits normal muscle tone. Coordination normal.  Skin: Skin is warm and dry. No rash noted.  Psychiatric: He has a normal mood and affect. His behavior is normal. Judgment and thought content normal.   Lab Results  Component Value Date   WBC 4.4* 03/15/2012   HGB 13.4 03/15/2012   HCT 39.2 03/15/2012   PLT 194.0 03/15/2012   GLUCOSE 127* 03/15/2012   CHOL 228* 03/15/2012   TRIG 93.0 03/15/2012   HDL 72.60 03/15/2012   LDLDIRECT 131.6 03/15/2012   ALT 32 03/15/2012   AST 24 03/15/2012   NA 142 03/15/2012   K 5.2* 03/16/2012   CL 109 03/15/2012   CREATININE 0.9 03/15/2012   BUN 28* 03/15/2012   CO2 26 03/15/2012   TSH 4.81 03/15/2012   PSA 0.50 03/15/2012   HGBA1C 5.8 03/15/2012     Procedure: Anoscopy Indication: bleeding Risks and benefits were explained to pt in detail. He was placed in lateral decubitus position. Digital rectal exam was normal  .  Stool was guaiac negative. . Anoscope was introduced without difficulty. No masses. Upon withdrawal normal mucosa was observed. There was an inflamed hemorrhoid at 6 o'clock 1 inch deep. Impression: inflamed internal hemorrhoid. No fissures. Tolerated well. Complications - none.      Assessment & Plan:

## 2012-09-21 NOTE — Assessment & Plan Note (Signed)
He can try Lipitor qod to reduce constipation Labs

## 2012-09-21 NOTE — Assessment & Plan Note (Signed)
A1c

## 2012-09-21 NOTE — Assessment & Plan Note (Signed)
Labs  Continue with current prescription therapy as reflected on the Med list.  

## 2012-09-21 NOTE — Patient Instructions (Signed)
Use Metamucil capsules

## 2012-09-21 NOTE — Assessment & Plan Note (Signed)
Likely a source of bleeding Proctocort supp x 10 d Gi cons if sx's re-occur in 1 mo

## 2012-12-23 ENCOUNTER — Other Ambulatory Visit: Payer: Self-pay | Admitting: Internal Medicine

## 2013-07-18 ENCOUNTER — Telehealth: Payer: Self-pay | Admitting: Internal Medicine

## 2013-07-18 NOTE — Telephone Encounter (Signed)
07/18/2013  Pt has CPE scheduled for 08/20/2013 and is wondering if he can have his labs before the appt.  Please contact pt to advise.

## 2013-07-18 NOTE — Telephone Encounter (Signed)
Left detailed mess informing pt to come to his OV first and if labs are needed he can have them afterwards.

## 2013-08-20 ENCOUNTER — Ambulatory Visit (INDEPENDENT_AMBULATORY_CARE_PROVIDER_SITE_OTHER): Payer: BC Managed Care – PPO | Admitting: Internal Medicine

## 2013-08-20 ENCOUNTER — Encounter: Payer: Self-pay | Admitting: Internal Medicine

## 2013-08-20 VITALS — BP 148/86 | HR 76 | Temp 98.7°F | Resp 16 | Ht 73.0 in | Wt 189.0 lb

## 2013-08-20 DIAGNOSIS — Z23 Encounter for immunization: Secondary | ICD-10-CM

## 2013-08-20 DIAGNOSIS — F4321 Adjustment disorder with depressed mood: Secondary | ICD-10-CM

## 2013-08-20 DIAGNOSIS — N320 Bladder-neck obstruction: Secondary | ICD-10-CM

## 2013-08-20 DIAGNOSIS — Z0000 Encounter for general adult medical examination without abnormal findings: Secondary | ICD-10-CM

## 2013-08-20 DIAGNOSIS — R7309 Other abnormal glucose: Secondary | ICD-10-CM

## 2013-08-20 DIAGNOSIS — M109 Gout, unspecified: Secondary | ICD-10-CM

## 2013-08-20 MED ORDER — LORATADINE 10 MG PO TABS: 10.0000 mg | ORAL_TABLET | Freq: Every day | ORAL | Status: DC

## 2013-08-20 MED ORDER — COLCHICINE 0.6 MG PO TABS: 0.6000 mg | ORAL_TABLET | Freq: Four times a day (QID) | ORAL | Status: DC | PRN

## 2013-08-20 NOTE — Assessment & Plan Note (Signed)
Discussed.

## 2013-08-20 NOTE — Assessment & Plan Note (Signed)
Continue with current prescription therapy as reflected on the Med list.  

## 2013-08-20 NOTE — Assessment & Plan Note (Addendum)
We discussed age appropriate health related issues, including available/recomended screening tests and vaccinations. We discussed a need for adhering to healthy diet and exercise. Labs/EKG were reviewed/ordered. All questions were answered. Colon due 2020   

## 2013-08-20 NOTE — Assessment & Plan Note (Signed)
A1c

## 2013-08-20 NOTE — Progress Notes (Signed)
Subjective:    Patient ID: George Fitzgerald, male    DOB: February 28, 1951, 62 y.o.   MRN: [id number]  HPI  The patient is here for a wellness exam. The patient has been doing well overall without major physical or psychological issues going on lately, except for occasional gout.  F/u elevated glucose, grief  BP Readings from Last 3 Encounters:  08/20/13 148/86  09/21/12 180/100  03/19/12 150/100   Wt Readings from Last 3 Encounters:  08/20/13 189 lb (85.73 kg)  09/21/12 198 lb (89.812 kg)  03/19/12 184 lb (83.462 kg)      Review of Systems  Constitutional: Negative for appetite change, fatigue and unexpected weight change.  HENT: Negative for congestion, nosebleeds, sneezing, sore throat and trouble swallowing.   Eyes: Negative for itching and visual disturbance.  Respiratory: Negative for cough.   Cardiovascular: Negative for chest pain, palpitations and leg swelling.  Gastrointestinal: Negative for nausea, diarrhea, blood in stool and abdominal distention.  Genitourinary: Negative for frequency and hematuria.  Musculoskeletal: Negative for back pain, gait problem, joint swelling and neck pain.  Skin: Negative for rash.  Neurological: Negative for dizziness, tremors, speech difficulty and weakness.  Psychiatric/Behavioral: Negative for sleep disturbance, dysphoric mood and agitation. The patient is not nervous/anxious.        Objective:   Physical Exam  Constitutional: He is oriented to person, place, and time. He appears well-developed and well-nourished. No distress.  HENT:  Head: Normocephalic and atraumatic.  Right Ear: External ear normal.  Left Ear: External ear normal.  Nose: Nose normal.  Mouth/Throat: Oropharynx is clear and moist. No oropharyngeal exudate.  Eyes: Conjunctivae and EOM are normal. Pupils are equal, round, and reactive to light. Right eye exhibits no discharge. Left eye exhibits no discharge. No scleral icterus.  Neck: Normal range of motion. Neck  supple. No JVD present. No tracheal deviation present. No thyromegaly present.  Cardiovascular: Normal rate, regular rhythm, normal heart sounds and intact distal pulses.  Exam reveals no gallop and no friction rub.   No murmur heard. Pulmonary/Chest: Effort normal and breath sounds normal. No stridor. No respiratory distress. He has no wheezes. He has no rales. He exhibits no tenderness.  Abdominal: Soft. Bowel sounds are normal. He exhibits no distension and no mass. There is no tenderness. There is no rebound and no guarding.  Genitourinary: Rectum normal and prostate normal. Guaiac negative stool. No penile tenderness.  Musculoskeletal: Normal range of motion. He exhibits no edema and no tenderness.  Lymphadenopathy:    He has no cervical adenopathy.  Neurological: He is alert and oriented to person, place, and time. He has normal reflexes. No cranial nerve deficit. He exhibits normal muscle tone. Coordination normal.  Skin: Skin is warm and dry. No rash noted. He is not diaphoretic. No erythema. No pallor.  Psychiatric: He has a normal mood and affect. His behavior is normal. Judgment and thought content normal.    Lab Results  Component Value Date   WBC 4.4* 03/15/2012   HGB 13.4 03/15/2012   HCT 39.2 03/15/2012   PLT 194.0 03/15/2012   GLUCOSE 127* 03/15/2012   CHOL 228* 03/15/2012   TRIG 93.0 03/15/2012   HDL 72.60 03/15/2012   LDLDIRECT 131.6 03/15/2012   ALT 32 03/15/2012   AST 24 03/15/2012   NA 142 03/15/2012   K 5.2* 03/16/2012   CL 109 03/15/2012   CREATININE 0.9 03/15/2012   BUN 28* 03/15/2012   CO2 26 03/15/2012   TSH 4.81 03/15/2012  PSA 0.50 03/15/2012   HGBA1C 5.8 03/15/2012         Assessment & Plan:

## 2013-08-20 NOTE — Progress Notes (Signed)
Pre visit review using our clinic review tool, if applicable. No additional management support is needed unless otherwise documented below in the visit note. 

## 2013-08-21 ENCOUNTER — Other Ambulatory Visit (INDEPENDENT_AMBULATORY_CARE_PROVIDER_SITE_OTHER): Payer: BC Managed Care – PPO

## 2013-08-21 ENCOUNTER — Encounter: Payer: Self-pay | Admitting: Internal Medicine

## 2013-08-21 DIAGNOSIS — R7309 Other abnormal glucose: Secondary | ICD-10-CM

## 2013-08-21 DIAGNOSIS — Z23 Encounter for immunization: Secondary | ICD-10-CM

## 2013-08-21 DIAGNOSIS — M109 Gout, unspecified: Secondary | ICD-10-CM

## 2013-08-21 DIAGNOSIS — F4321 Adjustment disorder with depressed mood: Secondary | ICD-10-CM

## 2013-08-21 DIAGNOSIS — N320 Bladder-neck obstruction: Secondary | ICD-10-CM

## 2013-08-21 DIAGNOSIS — Z0000 Encounter for general adult medical examination without abnormal findings: Secondary | ICD-10-CM

## 2013-08-21 LAB — CBC WITH DIFFERENTIAL/PLATELET
Basophils Absolute: 0.1 10*3/uL (ref 0.0–0.1)
Basophils Relative: 1.6 % (ref 0.0–3.0)
Eosinophils Relative: 3 % (ref 0.0–5.0)
HCT: 41.9 % (ref 39.0–52.0)
Hemoglobin: 14.2 g/dL (ref 13.0–17.0)
Lymphs Abs: 1.3 10*3/uL (ref 0.7–4.0)
MCV: 99.6 fl (ref 78.0–100.0)
Monocytes Absolute: 0.6 10*3/uL (ref 0.1–1.0)
Monocytes Relative: 13.5 % — ABNORMAL HIGH (ref 3.0–12.0)
Neutro Abs: 2.5 10*3/uL (ref 1.4–7.7)
RBC: 4.21 Mil/uL — ABNORMAL LOW (ref 4.22–5.81)
RDW: 12.6 % (ref 11.5–14.6)

## 2013-08-21 LAB — BASIC METABOLIC PANEL
CO2: 28 mEq/L (ref 19–32)
GFR: 75.46 mL/min (ref >60.00)
Glucose, Bld: 94 mg/dL (ref 70–99)
Potassium: 5.5 mEq/L — ABNORMAL HIGH (ref 3.5–5.1)
Sodium: 141 mEq/L (ref 135–145)

## 2013-08-21 LAB — HEPATIC FUNCTION PANEL
ALT: 29 U/L (ref 0–53)
Albumin: 4.3 g/dL (ref 3.5–5.2)
Alkaline Phosphatase: 63 U/L (ref 39–117)
Bilirubin, Direct: 0.1 mg/dL (ref 0.0–0.3)
Total Bilirubin: 0.8 mg/dL (ref 0.3–1.2)
Total Protein: 7 g/dL (ref 6.0–8.3)

## 2013-08-21 LAB — LIPID PANEL
Cholesterol: 200 mg/dL (ref 0–200)
HDL: 81.2 mg/dL (ref >39.00)
LDL Cholesterol: 110 mg/dL — ABNORMAL HIGH (ref 0–99)
Total CHOL/HDL Ratio: 2
Triglycerides: 42 mg/dL (ref 0.0–149.0)
VLDL: 8.4 mg/dL (ref 0.0–40.0)

## 2013-08-21 LAB — TSH: TSH: 4.35 u[IU]/mL (ref 0.35–5.50)

## 2013-08-21 LAB — URIC ACID: Uric Acid, Serum: 5 mg/dL (ref 4.0–7.8)

## 2013-08-21 LAB — HEMOGLOBIN A1C: Hgb A1c MFr Bld: 5.6 % (ref 4.6–6.5)

## 2013-08-23 ENCOUNTER — Other Ambulatory Visit: Payer: Self-pay | Admitting: Internal Medicine

## 2013-08-23 DIAGNOSIS — E875 Hyperkalemia: Secondary | ICD-10-CM

## 2013-11-20 ENCOUNTER — Other Ambulatory Visit: Payer: Self-pay | Admitting: Internal Medicine

## 2013-11-21 ENCOUNTER — Other Ambulatory Visit: Payer: Self-pay | Admitting: *Deleted

## 2013-11-21 MED ORDER — ATORVASTATIN CALCIUM 10 MG PO TABS: 10.0000 mg | ORAL_TABLET | Freq: Every day | ORAL | Status: DC

## 2013-11-21 NOTE — Telephone Encounter (Signed)
Please advise refill for Uloric.   Last filled by MD on - 08/09/2012 #30 x3 Last Appt: 08/20/2013 Next Appt: none

## 2013-12-19 ENCOUNTER — Telehealth: Payer: Self-pay | Admitting: *Deleted

## 2013-12-19 NOTE — Telephone Encounter (Signed)
Patient is calling to check status of refill. On Saturday he is leaving for [place] for 1 month and wants to know if this can be done before we leave this afternoon. Please advise.

## 2013-12-19 NOTE — Telephone Encounter (Signed)
Rf req for Tramadol 50 mg 1-2 po bid prn pain. Ok to Rf? 

## 2013-12-23 NOTE — Telephone Encounter (Signed)
OK to fill this prescription with additional refills x1 Thank you!  

## 2013-12-25 MED ORDER — TRAMADOL HCL 50 MG PO TABS: ORAL_TABLET | ORAL | Status: DC

## 2013-12-25 NOTE — Telephone Encounter (Signed)
Refill done.  

## 2015-01-01 ENCOUNTER — Other Ambulatory Visit: Payer: Self-pay | Admitting: Internal Medicine

## 2015-01-01 ENCOUNTER — Encounter: Payer: Self-pay | Admitting: Internal Medicine

## 2015-04-21 ENCOUNTER — Encounter: Payer: Self-pay | Admitting: Gastroenterology

## 2015-05-05 ENCOUNTER — Ambulatory Visit (INDEPENDENT_AMBULATORY_CARE_PROVIDER_SITE_OTHER): Payer: Medicare Other | Admitting: Internal Medicine

## 2015-05-05 ENCOUNTER — Encounter: Payer: Self-pay | Admitting: Internal Medicine

## 2015-05-05 ENCOUNTER — Other Ambulatory Visit (INDEPENDENT_AMBULATORY_CARE_PROVIDER_SITE_OTHER): Payer: Medicare Other

## 2015-05-05 VITALS — BP 190/100 | HR 81 | Wt 192.0 lb

## 2015-05-05 DIAGNOSIS — R030 Elevated blood-pressure reading, without diagnosis of hypertension: Secondary | ICD-10-CM

## 2015-05-05 DIAGNOSIS — R202 Paresthesia of skin: Secondary | ICD-10-CM

## 2015-05-05 DIAGNOSIS — E785 Hyperlipidemia, unspecified: Secondary | ICD-10-CM

## 2015-05-05 DIAGNOSIS — IMO0001 Reserved for inherently not codable concepts without codable children: Secondary | ICD-10-CM

## 2015-05-05 DIAGNOSIS — R453 Demoralization and apathy: Secondary | ICD-10-CM | POA: Diagnosis not present

## 2015-05-05 DIAGNOSIS — F4321 Adjustment disorder with depressed mood: Secondary | ICD-10-CM

## 2015-05-05 DIAGNOSIS — M1000 Idiopathic gout, unspecified site: Secondary | ICD-10-CM

## 2015-05-05 DIAGNOSIS — E559 Vitamin D deficiency, unspecified: Secondary | ICD-10-CM | POA: Diagnosis not present

## 2015-05-05 DIAGNOSIS — R739 Hyperglycemia, unspecified: Secondary | ICD-10-CM | POA: Diagnosis not present

## 2015-05-05 DIAGNOSIS — E038 Other specified hypothyroidism: Secondary | ICD-10-CM

## 2015-05-05 DIAGNOSIS — N320 Bladder-neck obstruction: Secondary | ICD-10-CM

## 2015-05-05 DIAGNOSIS — E034 Atrophy of thyroid (acquired): Secondary | ICD-10-CM

## 2015-05-05 DIAGNOSIS — E538 Deficiency of other specified B group vitamins: Secondary | ICD-10-CM

## 2015-05-05 DIAGNOSIS — F329 Major depressive disorder, single episode, unspecified: Secondary | ICD-10-CM

## 2015-05-05 HISTORY — DX: Major depressive disorder, single episode, unspecified: F32.9

## 2015-05-05 LAB — HEPATIC FUNCTION PANEL
ALBUMIN: 4.9 g/dL (ref 3.5–5.2)
ALK PHOS: 73 U/L (ref 39–117)
ALT: 30 U/L (ref 0–53)
AST: 26 U/L (ref 0–37)
BILIRUBIN DIRECT: 0.1 mg/dL (ref 0.0–0.3)
TOTAL PROTEIN: 7.6 g/dL (ref 6.0–8.3)
Total Bilirubin: 0.7 mg/dL (ref 0.2–1.2)

## 2015-05-05 LAB — URINALYSIS
Bilirubin Urine: NEGATIVE
HGB URINE DIPSTICK: NEGATIVE
KETONES UR: NEGATIVE
Leukocytes, UA: NEGATIVE
Nitrite: NEGATIVE
Specific Gravity, Urine: >=1.03 — AB (ref 1.000–1.030)
TOTAL PROTEIN, URINE-UPE24: NEGATIVE
URINE GLUCOSE: NEGATIVE
UROBILINOGEN UA: 0.2 (ref 0.0–1.0)
pH: 6 (ref 5.0–8.0)

## 2015-05-05 LAB — BASIC METABOLIC PANEL
BUN: 19 mg/dL (ref 6–23)
CO2: 29 meq/L (ref 19–32)
Calcium: 10 mg/dL (ref 8.4–10.5)
Chloride: 103 mEq/L (ref 96–112)
Creatinine, Ser: 1.05 mg/dL (ref 0.40–1.50)
GFR: 75.06 mL/min (ref >60.00)
GLUCOSE: 96 mg/dL (ref 70–99)
POTASSIUM: 5.2 meq/L — AB (ref 3.5–5.1)
SODIUM: 140 meq/L (ref 135–145)

## 2015-05-05 LAB — CBC WITH DIFFERENTIAL/PLATELET
Basophils Absolute: 0.1 10*3/uL (ref 0.0–0.1)
Basophils Relative: 1.1 % (ref 0.0–3.0)
EOS PCT: 1.4 % (ref 0.0–5.0)
Eosinophils Absolute: 0.1 10*3/uL (ref 0.0–0.7)
HEMATOCRIT: 43.5 % (ref 39.0–52.0)
Hemoglobin: 15 g/dL (ref 13.0–17.0)
LYMPHS ABS: 1.1 10*3/uL (ref 0.7–4.0)
LYMPHS PCT: 19.8 % (ref 12.0–46.0)
MCHC: 34.4 g/dL (ref 30.0–36.0)
MCV: 98.3 fl (ref 78.0–100.0)
MONOS PCT: 11.1 % (ref 3.0–12.0)
Monocytes Absolute: 0.6 10*3/uL (ref 0.1–1.0)
NEUTROS ABS: 3.6 10*3/uL (ref 1.4–7.7)
NEUTROS PCT: 66.6 % (ref 43.0–77.0)
Platelets: 192 10*3/uL (ref 150.0–400.0)
RBC: 4.43 Mil/uL (ref 4.22–5.81)
RDW: 13.5 % (ref 11.5–15.5)
WBC: 5.4 10*3/uL (ref 4.0–10.5)

## 2015-05-05 LAB — TSH: TSH: 4.85 u[IU]/mL — AB (ref 0.35–4.50)

## 2015-05-05 LAB — HEMOGLOBIN A1C: Hgb A1c MFr Bld: 5.4 % (ref 4.6–6.5)

## 2015-05-05 LAB — LIPID PANEL
CHOL/HDL RATIO: 4
Cholesterol: 306 mg/dL — ABNORMAL HIGH (ref 0–200)
HDL: 70.3 mg/dL (ref >39.00)
LDL Cholesterol: 220 mg/dL — ABNORMAL HIGH (ref 0–99)
NONHDL: 235.54
Triglycerides: 76 mg/dL (ref 0.0–149.0)
VLDL: 15.2 mg/dL (ref 0.0–40.0)

## 2015-05-05 LAB — VITAMIN B12: Vitamin B-12: 226 pg/mL (ref 211–911)

## 2015-05-05 LAB — T4, FREE: FREE T4: 0.56 ng/dL — AB (ref 0.60–1.60)

## 2015-05-05 LAB — URIC ACID: URIC ACID, SERUM: 9.7 mg/dL — AB (ref 4.0–7.8)

## 2015-05-05 LAB — PSA: PSA: 0.98 ng/mL (ref 0.10–4.00)

## 2015-05-05 LAB — TESTOSTERONE: TESTOSTERONE: 520.84 ng/dL (ref 300.00–890.00)

## 2015-05-05 MED ORDER — LOSARTAN POTASSIUM 100 MG PO TABS: 100.0000 mg | ORAL_TABLET | Freq: Every day | ORAL | Status: DC

## 2015-05-05 MED ORDER — BUPROPION HCL ER (SR) 100 MG PO TB12: 100.0000 mg | ORAL_TABLET | Freq: Every day | ORAL | Status: DC

## 2015-05-05 NOTE — Assessment & Plan Note (Signed)
Losartan 100 mg/d 

## 2015-05-05 NOTE — Assessment & Plan Note (Signed)
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

## 2015-05-05 NOTE — Assessment & Plan Note (Signed)
8/16 multifactorial

## 2015-05-05 NOTE — Progress Notes (Signed)
Subjective:  Patient ID: George Fitzgerald, male    DOB: 04/27/1951  Age: 64 y.o. MRN: [id number]  CC: No chief complaint on file.   HPI George Fitzgerald presents for fatigue, depressed mood, anxiety, apathy ETOH: 3-4 drinks/d  Outpatient Prescriptions Prior to Visit  Medication Sig Dispense Refill  . aspirin 81 MG tablet Take 81 mg by mouth daily.      Marland Kitchen atorvastatin (LIPITOR) 10 MG tablet Take 1 tablet (10 mg total) by mouth daily. 90 tablet 1  . cetirizine (ZYRTEC) 10 MG tablet Take 10 mg by mouth daily.    Marland Kitchen omeprazole (PRILOSEC) 20 MG capsule Take 20 mg by mouth daily.    . traMADol (ULTRAM) 50 MG tablet take 1-2 tablets by mouth twice a day if needed for pain maximum daily dose of 8 60 tablet 1  . ULORIC 80 MG TABS take 1 tablet by mouth once daily 90 tablet 3  . Cholecalciferol (VITAMIN D3) 2000 UNITS capsule Take 2,000 Units by mouth daily.     . colchicine 0.6 MG tablet Take 1 tablet (0.6 mg total) by mouth 4 (four) times daily as needed. 30 tablet 5  . dextromethorphan-guaiFENesin (MUCINEX DM) 30-600 MG per 12 hr tablet Take 1 tablet by mouth daily as needed for cough.    . fluticasone (FLONASE) 50 MCG/ACT nasal spray Place 2 sprays into the nose daily. 16 g 6  . hydrocortisone (ANUSOL-HC) 25 MG suppository Place 1 suppository (25 mg total) rectally 2 (two) times daily. (Patient not taking: Reported on 05/05/2015) 20 suppository 1  . loratadine (CLARITIN) 10 MG tablet Take 1 tablet (10 mg total) by mouth daily. (Patient not taking: Reported on 05/05/2015) 100 tablet 3  . naproxen sodium (ANAPROX) 220 MG tablet Take 220 mg by mouth daily as needed.    . Omega-3 Fatty Acids (FISH OIL) 1000 MG CAPS Take 1 capsule by mouth daily.       Facility-Administered Medications Prior to Visit  Medication Dose Route Frequency Provider Last Rate Last Dose  . methylPREDNISolone acetate (DEPO-MEDROL) injection 20 mg  20 mg Intra-Lesional Once Alexandre Lightsey V, MD      . methylPREDNISolone acetate  (DEPO-MEDROL) injection 40 mg  40 mg Intra-articular Once Lew Dawes V, MD        ROS Review of Systems  Objective:  BP 190/100 mmHg  Pulse 81  Wt 192 lb (87.091 kg)  SpO2 95%  BP Readings from Last 3 Encounters:  05/05/15 190/100  08/20/13 148/86  09/21/12 180/100    Wt Readings from Last 3 Encounters:  05/05/15 192 lb (87.091 kg)  08/20/13 189 lb (85.73 kg)  09/21/12 198 lb (89.812 kg)    Physical Exam  Lab Results  Component Value Date   WBC 4.7 08/21/2013   HGB 14.2 08/21/2013   HCT 41.9 08/21/2013   PLT 220.0 08/21/2013   GLUCOSE 94 08/21/2013   CHOL 200 08/21/2013   TRIG 42.0 08/21/2013   HDL 81.20 08/21/2013   LDLDIRECT 131.6 03/15/2012   LDLCALC 110* 08/21/2013   ALT 29 08/21/2013   AST 29 08/21/2013   NA 141 08/21/2013   K 5.5* 08/21/2013   CL 107 08/21/2013   CREATININE 1.1 08/21/2013   BUN 17 08/21/2013   CO2 28 08/21/2013   TSH 4.35 08/21/2013   PSA 0.61 08/21/2013   HGBA1C 5.6 08/21/2013    No results found.  Assessment & Plan:   There are no diagnoses linked to this encounter. I am having [name] maintain his aspirin, Fish Oil, Vitamin D3, cetirizine, fluticasone, hydrocortisone, dextromethorphan-guaiFENesin, omeprazole, naproxen sodium, colchicine, loratadine, ULORIC, atorvastatin, and traMADol. We will continue to administer methylPREDNISolone acetate and methylPREDNISolone acetate.  No orders of the defined types were placed in this encounter.     Follow-up: No Follow-up on file.  Walker Kehr, MD

## 2015-05-05 NOTE — Progress Notes (Signed)
Pre visit review using our clinic review tool, if applicable. No additional management support is needed unless otherwise documented below in the visit note. 

## 2015-05-06 ENCOUNTER — Encounter: Payer: Self-pay | Admitting: Internal Medicine

## 2015-05-06 DIAGNOSIS — E039 Hypothyroidism, unspecified: Secondary | ICD-10-CM

## 2015-05-06 DIAGNOSIS — E559 Vitamin D deficiency, unspecified: Secondary | ICD-10-CM | POA: Insufficient documentation

## 2015-05-06 DIAGNOSIS — E538 Deficiency of other specified B group vitamins: Secondary | ICD-10-CM | POA: Insufficient documentation

## 2015-05-06 HISTORY — DX: Hypothyroidism, unspecified: E03.9

## 2015-05-06 LAB — VITAMIN D 25 HYDROXY (VIT D DEFICIENCY, FRACTURES): VITD: 26.5 ng/mL — ABNORMAL LOW (ref 30.00–100.00)

## 2015-05-06 MED ORDER — VITAMIN B-12 1000 MCG SL SUBL: 1.0000 | SUBLINGUAL_TABLET | Freq: Every day | SUBLINGUAL | Status: AC

## 2015-05-06 MED ORDER — ERGOCALCIFEROL 1.25 MG (50000 UT) PO CAPS: 50000.0000 [IU] | ORAL_CAPSULE | ORAL | Status: DC

## 2015-05-06 MED ORDER — LEVOTHROID 25 MCG PO TABS: 25.0000 ug | ORAL_TABLET | Freq: Every day | ORAL | Status: DC

## 2015-05-06 NOTE — Assessment & Plan Note (Addendum)
Labs  Losartan, Uloric, colchicine

## 2015-05-06 NOTE — Assessment & Plan Note (Signed)
start Vit D prescription 50000 iu weekly (Rx emailed to your pharmacy) followed by over-the-counter Vit D 2000 iu daily.  

## 2015-05-06 NOTE — Assessment & Plan Note (Signed)
B12 SL 1000 mcg/d

## 2015-05-06 NOTE — Assessment & Plan Note (Signed)
Start Synthroid 25 mcg/d

## 2015-05-07 ENCOUNTER — Telehealth: Payer: Self-pay | Admitting: *Deleted

## 2015-05-07 MED ORDER — LEVOTHYROXINE SODIUM 25 MCG PO TABS: 25.0000 ug | ORAL_TABLET | Freq: Every day | ORAL | Status: DC

## 2015-05-07 NOTE — Telephone Encounter (Signed)
Spoke with [name]/pharmacist states md sent script for Levothroid. Med is no longer on market ok to change to synthroid. Per md notes he wanted synthroid gave verbal to change to synthroid. Sent updated script...George Fitzgerald

## 2015-05-07 NOTE — Telephone Encounter (Signed)
Received call pt states he would like the generic synthroid instead his insurance will cover brand name med is too expensive. Inform pt will send generic "levothyroxine"...George Fitzgerald

## 2015-05-08 ENCOUNTER — Encounter: Payer: Self-pay | Admitting: Internal Medicine

## 2015-05-20 ENCOUNTER — Encounter: Payer: Self-pay | Admitting: Internal Medicine

## 2015-05-20 ENCOUNTER — Ambulatory Visit (INDEPENDENT_AMBULATORY_CARE_PROVIDER_SITE_OTHER): Payer: Medicare Other | Admitting: Internal Medicine

## 2015-05-20 VITALS — BP 128/88 | HR 74 | Ht 74.0 in | Wt 190.0 lb

## 2015-05-20 DIAGNOSIS — Z23 Encounter for immunization: Secondary | ICD-10-CM

## 2015-05-20 DIAGNOSIS — IMO0001 Reserved for inherently not codable concepts without codable children: Secondary | ICD-10-CM

## 2015-05-20 DIAGNOSIS — E785 Hyperlipidemia, unspecified: Secondary | ICD-10-CM

## 2015-05-20 DIAGNOSIS — E538 Deficiency of other specified B group vitamins: Secondary | ICD-10-CM

## 2015-05-20 DIAGNOSIS — R030 Elevated blood-pressure reading, without diagnosis of hypertension: Secondary | ICD-10-CM | POA: Diagnosis not present

## 2015-05-20 DIAGNOSIS — E875 Hyperkalemia: Secondary | ICD-10-CM

## 2015-05-20 DIAGNOSIS — Z0000 Encounter for general adult medical examination without abnormal findings: Secondary | ICD-10-CM

## 2015-05-20 DIAGNOSIS — M1000 Idiopathic gout, unspecified site: Secondary | ICD-10-CM

## 2015-05-20 DIAGNOSIS — E559 Vitamin D deficiency, unspecified: Secondary | ICD-10-CM

## 2015-05-20 MED ORDER — TRAMADOL HCL 50 MG PO TABS: ORAL_TABLET | ORAL | Status: DC

## 2015-05-20 MED ORDER — ATORVASTATIN CALCIUM 10 MG PO TABS: 10.0000 mg | ORAL_TABLET | Freq: Every day | ORAL | Status: DC

## 2015-05-20 MED ORDER — INDOMETHACIN 50 MG PO CAPS: 50.0000 mg | ORAL_CAPSULE | Freq: Three times a day (TID) | ORAL | Status: DC | PRN

## 2015-05-20 MED ORDER — COLCHICINE 0.6 MG PO TABS: 0.6000 mg | ORAL_TABLET | Freq: Four times a day (QID) | ORAL | Status: DC | PRN

## 2015-05-20 MED ORDER — FEBUXOSTAT 80 MG PO TABS: 1.0000 | ORAL_TABLET | Freq: Every day | ORAL | Status: DC

## 2015-05-20 NOTE — Assessment & Plan Note (Addendum)
Here for medicare wellness/physical  Diet: heart healthy  Physical activity: not sedentary  Depression/mood screen: negative  Hearing: intact to whispered voice  Visual acuity: grossly normal, performs annual eye exam  ADLs: capable  Fall risk: none  Home safety: good  Cognitive evaluation: intact to orientation, naming, recall and repetition  EOL planning: adv directives, full code/ I agree  I have personally reviewed and have noted  1. The patient's medical, surgical and social history  2. Their use of alcohol, tobacco or illicit drugs  3. Their current medications and supplements  4. The patient's functional ability including ADL's, fall risks, home safety risks and hearing or visual impairment.  5. Diet and physical activities  6. Evidence for depression or mood disorders 7. The roster of all physicians providing medical care to patient - is listed in the Snapshot section of the chart and reviewed today.    Today patient counseled on age appropriate routine health concerns for screening and prevention, each reviewed and up to date or declined. Immunizations reviewed and up to date or declined. Labs ordered and reviewed. Risk factors for depression reviewed and negative. Hearing function and visual acuity are intact. ADLs screened and addressed as needed. Functional ability and level of safety reviewed and appropriate. Education, counseling and referrals performed based on assessed risks today. Patient provided with a copy of personalized plan for preventive services.    Colon due in 2020 Shots

## 2015-05-20 NOTE — Assessment & Plan Note (Signed)
Recheck in 2-3 wks Care w/ARB use

## 2015-05-20 NOTE — Progress Notes (Signed)
Pre visit review using our clinic review tool, if applicable. No additional management support is needed unless otherwise documented below in the visit note. 

## 2015-05-20 NOTE — Assessment & Plan Note (Signed)
Losartan 

## 2015-05-20 NOTE — Assessment & Plan Note (Signed)
Re-start Lipitor Cut back on ETOH

## 2015-05-20 NOTE — Assessment & Plan Note (Signed)
He started Vit D Rx

## 2015-05-20 NOTE — Assessment & Plan Note (Signed)
Re-start Uloric

## 2015-05-20 NOTE — Patient Instructions (Signed)
Eat 2-3 times a day   Preventive Care for Adults A healthy lifestyle and preventive care can promote health and wellness. Preventive health guidelines for men include the following key practices:  A routine yearly physical is a good way to check with your health care provider about your health and preventative screening. It is a chance to share any concerns and updates on your health and to receive a thorough exam.  Visit your dentist for a routine exam and preventative care every 6 months. Brush your teeth twice a day and floss once a day. Good oral hygiene prevents tooth decay and gum disease.  The frequency of eye exams is based on your age, health, family medical history, use of contact lenses, and other factors. Follow your health care provider's recommendations for frequency of eye exams.  Eat a healthy diet. Foods such as vegetables, fruits, whole grains, low-fat dairy products, and lean protein foods contain the nutrients you need without too many calories. Decrease your intake of foods high in solid fats, added sugars, and salt. Eat the right amount of calories for you.Get information about a proper diet from your health care provider, if necessary.  Regular physical exercise is one of the most important things you can do for your health. Most adults should get at least 150 minutes of moderate-intensity exercise (any activity that increases your heart rate and causes you to sweat) each week. In addition, most adults need muscle-strengthening exercises on 2 or more days a week.  Maintain a healthy weight. The body mass index (BMI) is a screening tool to identify possible weight problems. It provides an estimate of body fat based on height and weight. Your health care provider can find your BMI and can help you achieve or maintain a healthy weight.For adults 20 years and older:  A BMI below 18.5 is considered underweight.  A BMI of 18.5 to 24.9 is normal.  A BMI of 25 to 29.9 is  considered overweight.  A BMI of 30 and above is considered obese.  Maintain normal blood lipids and cholesterol levels by exercising and minimizing your intake of saturated fat. Eat a balanced diet with plenty of fruit and vegetables. Blood tests for lipids and cholesterol should begin at age 60 and be repeated every 5 years. If your lipid or cholesterol levels are high, you are over 50, or you are at high risk for heart disease, you may need your cholesterol levels checked more frequently.Ongoing high lipid and cholesterol levels should be treated with medicines if diet and exercise are not working.  If you smoke, find out from your health care provider how to quit. If you do not use tobacco, do not start.  Lung cancer screening is recommended for adults aged 79-80 years who are at high risk for developing lung cancer because of a history of smoking. A yearly low-dose CT scan of the lungs is recommended for people who have at least a 30-pack-year history of smoking and are a current smoker or have quit within the past 15 years. A pack year of smoking is smoking an average of 1 pack of cigarettes a day for 1 year (for example: 1 pack a day for 30 years or 2 packs a day for 15 years). Yearly screening should continue until the smoker has stopped smoking for at least 15 years. Yearly screening should be stopped for people who develop a health problem that would prevent them from having lung cancer treatment.  If you choose to  drink alcohol, do not have more than 2 drinks per day. One drink is considered to be 12 ounces (355 mL) of beer, 5 ounces (148 mL) of wine, or 1.5 ounces (44 mL) of liquor.  Avoid use of street drugs. Do not share needles with anyone. Ask for help if you need support or instructions about stopping the use of drugs.  High blood pressure causes heart disease and increases the risk of stroke. Your blood pressure should be checked at least every 1-2 years. Ongoing high blood pressure  should be treated with medicines, if weight loss and exercise are not effective.  If you are 33-51 years old, ask your health care provider if you should take aspirin to prevent heart disease.  Diabetes screening involves taking a blood sample to check your fasting blood sugar level. This should be done once every 3 years, after age 48, if you are within normal weight and without risk factors for diabetes. Testing should be considered at a younger age or be carried out more frequently if you are overweight and have at least 1 risk factor for diabetes.  Colorectal cancer can be detected and often prevented. Most routine colorectal cancer screening begins at the age of 47 and continues through age 45. However, your health care provider may recommend screening at an earlier age if you have risk factors for colon cancer. On a yearly basis, your health care provider may provide home test kits to check for hidden blood in the stool. Use of a small camera at the end of a tube to directly examine the colon (sigmoidoscopy or colonoscopy) can detect the earliest forms of colorectal cancer. Talk to your health care provider about this at age 54, when routine screening begins. Direct exam of the colon should be repeated every 5-10 years through age 56, unless early forms of precancerous polyps or small growths are found.  People who are at an increased risk for hepatitis B should be screened for this virus. You are considered at high risk for hepatitis B if:  You were born in a country where hepatitis B occurs often. Talk with your health care provider about which countries are considered high risk.  Your parents were born in a high-risk country and you have not received a shot to protect against hepatitis B (hepatitis B vaccine).  You have HIV or AIDS.  You use needles to inject street drugs.  You live with, or have sex with, someone who has hepatitis B.  You are a man who has sex with other men  (MSM).  You get hemodialysis treatment.  You take certain medicines for conditions such as cancer, organ transplantation, and autoimmune conditions.  Hepatitis C blood testing is recommended for all people born from [birth year] through 1965 and any individual with known risks for hepatitis C.  Practice safe sex. Use condoms and avoid high-risk sexual practices to reduce the spread of sexually transmitted infections (STIs). STIs include gonorrhea, chlamydia, syphilis, trichomonas, herpes, HPV, and human immunodeficiency virus (HIV). Herpes, HIV, and HPV are viral illnesses that have no cure. They can result in disability, cancer, and death.  If you are at risk of being infected with HIV, it is recommended that you take a prescription medicine daily to prevent HIV infection. This is called preexposure prophylaxis (PrEP). You are considered at risk if:  You are a man who has sex with other men (MSM) and have other risk factors.  You are a heterosexual man, are sexually active, and  are at increased risk for HIV infection.  You take drugs by injection.  You are sexually active with a partner who has HIV.  Talk with your health care provider about whether you are at high risk of being infected with HIV. If you choose to begin PrEP, you should first be tested for HIV. You should then be tested every 3 months for as long as you are taking PrEP.  A one-time screening for abdominal aortic aneurysm (AAA) and surgical repair of large AAAs by ultrasound are recommended for men ages 97 to 19 years who are current or former smokers.  Healthy men should no longer receive prostate-specific antigen (PSA) blood tests as part of routine cancer screening. Talk with your health care provider about prostate cancer screening.  Testicular cancer screening is not recommended for adult males who have no symptoms. Screening includes self-exam, a health care provider exam, and other screening tests. Consult with your health  care provider about any symptoms you have or any concerns you have about testicular cancer.  Use sunscreen. Apply sunscreen liberally and repeatedly throughout the day. You should seek shade when your shadow is shorter than you. Protect yourself by wearing long sleeves, pants, a wide-brimmed hat, and sunglasses year round, whenever you are outdoors.  Once a month, do a whole-body skin exam, using a mirror to look at the skin on your back. Tell your health care provider about new moles, moles that have irregular borders, moles that are larger than a pencil eraser, or moles that have changed in shape or color.  Stay current with required vaccines (immunizations).  Influenza vaccine. All adults should be immunized every year.  Tetanus, diphtheria, and acellular pertussis (Td, Tdap) vaccine. An adult who has not previously received Tdap or who does not know his vaccine status should receive 1 dose of Tdap. This initial dose should be followed by tetanus and diphtheria toxoids (Td) booster doses every 10 years. Adults with an unknown or incomplete history of completing a 3-dose immunization series with Td-containing vaccines should begin or complete a primary immunization series including a Tdap dose. Adults should receive a Td booster every 10 years.  Varicella vaccine. An adult without evidence of immunity to varicella should receive 2 doses or a second dose if he has previously received 1 dose.  Human papillomavirus (HPV) vaccine. Males aged 91-21 years who have not received the vaccine previously should receive the 3-dose series. Males aged 22-26 years may be immunized. Immunization is recommended through the age of 45 years for any male who has sex with males and did not get any or all doses earlier. Immunization is recommended for any person with an immunocompromised condition through the age of 11 years if he did not get any or all doses earlier. During the 3-dose series, the second dose should be  obtained 4-8 weeks after the first dose. The third dose should be obtained 24 weeks after the first dose and 16 weeks after the second dose.  Zoster vaccine. One dose is recommended for adults aged 59 years or older unless certain conditions are present.  Measles, mumps, and rubella (MMR) vaccine. Adults born before [birth year] generally are considered immune to measles and mumps. Adults born in [birth year] or later should have 1 or more doses of MMR vaccine unless there is a contraindication to the vaccine or there is laboratory evidence of immunity to each of the three diseases. A routine second dose of MMR vaccine should be obtained at least 28 days  after the first dose for students attending postsecondary schools, health care workers, or international travelers. People who received inactivated measles vaccine or an unknown type of measles vaccine during 1963-1967 should receive 2 doses of MMR vaccine. People who received inactivated mumps vaccine or an unknown type of mumps vaccine before 1979 and are at high risk for mumps infection should consider immunization with 2 doses of MMR vaccine. Unvaccinated health care workers born before [birth year] who lack laboratory evidence of measles, mumps, or rubella immunity or laboratory confirmation of disease should consider measles and mumps immunization with 2 doses of MMR vaccine or rubella immunization with 1 dose of MMR vaccine.  Pneumococcal 13-valent conjugate (PCV13) vaccine. When indicated, a person who is uncertain of his immunization history and has no record of immunization should receive the PCV13 vaccine. An adult aged 49 years or older who has certain medical conditions and has not been previously immunized should receive 1 dose of PCV13 vaccine. This PCV13 should be followed with a dose of pneumococcal polysaccharide (PPSV23) vaccine. The PPSV23 vaccine dose should be obtained at least 8 weeks after the dose of PCV13 vaccine. An adult aged 49 years or older who has  certain medical conditions and previously received 1 or more doses of PPSV23 vaccine should receive 1 dose of PCV13. The PCV13 vaccine dose should be obtained 1 or more years after the last PPSV23 vaccine dose.  Pneumococcal polysaccharide (PPSV23) vaccine. When PCV13 is also indicated, PCV13 should be obtained first. All adults aged 8 years and older should be immunized. An adult younger than age 94 years who has certain medical conditions should be immunized. Any person who resides in a nursing home or long-term care facility should be immunized. An adult smoker should be immunized. People with an immunocompromised condition and certain other conditions should receive both PCV13 and PPSV23 vaccines. People with human immunodeficiency virus (HIV) infection should be immunized as soon as possible after diagnosis. Immunization during chemotherapy or radiation therapy should be avoided. Routine use of PPSV23 vaccine is not recommended for American Indians, [place] Natives, or people younger than 65 years unless there are medical conditions that require PPSV23 vaccine. When indicated, people who have unknown immunization and have no record of immunization should receive PPSV23 vaccine. One-time revaccination 5 years after the first dose of PPSV23 is recommended for people aged 19-64 years who have chronic kidney failure, nephrotic syndrome, asplenia, or immunocompromised conditions. People who received 1-2 doses of PPSV23 before age 65 years should receive another dose of PPSV23 vaccine at age 70 years or later if at least 5 years have passed since the previous dose. Doses of PPSV23 are not needed for people immunized with PPSV23 at or after age 102 years.  Meningococcal vaccine. Adults with asplenia or persistent complement component deficiencies should receive 2 doses of quadrivalent meningococcal conjugate (MenACWY-D) vaccine. The doses should be obtained at least 2 months apart. Microbiologists working with  certain meningococcal bacteria, [place] recruits, people at risk during an outbreak, and people who travel to or live in countries with a high rate of meningitis should be immunized. A first-year college student up through age 57 years who is living in a residence hall should receive a dose if he did not receive a dose on or after his 16th birthday. Adults who have certain high-risk conditions should receive one or more doses of vaccine.  Hepatitis A vaccine. Adults who wish to be protected from this disease, have certain high-risk conditions, work with hepatitis A-infected  animals, work in hepatitis A research labs, or travel to or work in countries with a high rate of hepatitis A should be immunized. Adults who were previously unvaccinated and who anticipate close contact with an international adoptee during the first 60 days after arrival in the [place] from a country with a high rate of hepatitis A should be immunized.  Hepatitis B vaccine. Adults should be immunized if they wish to be protected from this disease, have certain high-risk conditions, may be exposed to blood or other infectious body fluids, are household contacts or sex partners of hepatitis B positive people, are clients or workers in certain care facilities, or travel to or work in countries with a high rate of hepatitis B.  Haemophilus influenzae type b (Hib) vaccine. A previously unvaccinated person with asplenia or sickle cell disease or having a scheduled splenectomy should receive 1 dose of Hib vaccine. Regardless of previous immunization, a recipient of a hematopoietic stem cell transplant should receive a 3-dose series 6-12 months after his successful transplant. Hib vaccine is not recommended for adults with HIV infection. Preventive Service / Frequency Ages 7 to 58  Blood pressure check.** / Every 1 to 2 years.  Lipid and cholesterol check.** / Every 5 years beginning at age 53.  Hepatitis C blood test.** / For any  individual with known risks for hepatitis C.  Skin self-exam. / Monthly.  Influenza vaccine. / Every year.  Tetanus, diphtheria, and acellular pertussis (Tdap, Td) vaccine.** / Consult your health care provider. 1 dose of Td every 10 years.  Varicella vaccine.** / Consult your health care provider.  HPV vaccine. / 3 doses over 6 months, if 74 or younger.  Measles, mumps, rubella (MMR) vaccine.** / You need at least 1 dose of MMR if you were born in 1957 or later. You may also need a second dose.  Pneumococcal 13-valent conjugate (PCV13) vaccine.** / Consult your health care provider.  Pneumococcal polysaccharide (PPSV23) vaccine.** / 1 to 2 doses if you smoke cigarettes or if you have certain conditions.  Meningococcal vaccine.** / 1 dose if you are age 77 to 42 years and a Market researcher living in a residence hall, or have one of several medical conditions. You may also need additional booster doses.  Hepatitis A vaccine.** / Consult your health care provider.  Hepatitis B vaccine.** / Consult your health care provider.  Haemophilus influenzae type b (Hib) vaccine.** / Consult your health care provider. Ages 8 to 55  Blood pressure check.** / Every 1 to 2 years.  Lipid and cholesterol check.** / Every 5 years beginning at age 21.  Lung cancer screening. / Every year if you are aged 35-80 years and have a 30-pack-year history of smoking and currently smoke or have quit within the past 15 years. Yearly screening is stopped once you have quit smoking for at least 15 years or develop a health problem that would prevent you from having lung cancer treatment.  Fecal occult blood test (FOBT) of stool. / Every year beginning at age 41 and continuing until age 43. You may not have to do this test if you get a colonoscopy every 10 years.  Flexible sigmoidoscopy** or colonoscopy.** / Every 5 years for a flexible sigmoidoscopy or every 10 years for a colonoscopy beginning at age  58 and continuing until age 22.  Hepatitis C blood test.** / For all people born from [birth year] through 1965 and any individual with known risks for hepatitis C.  Skin  self-exam. / Monthly.  Influenza vaccine. / Every year.  Tetanus, diphtheria, and acellular pertussis (Tdap/Td) vaccine.** / Consult your health care provider. 1 dose of Td every 10 years.  Varicella vaccine.** / Consult your health care provider.  Zoster vaccine.** / 1 dose for adults aged 47 years or older.  Measles, mumps, rubella (MMR) vaccine.** / You need at least 1 dose of MMR if you were born in 1959 or later. You may also need a second dose.  Pneumococcal 13-valent conjugate (PCV13) vaccine.** / Consult your health care provider.  Pneumococcal polysaccharide (PPSV23) vaccine.** / 1 to 2 doses if you smoke cigarettes or if you have certain conditions.  Meningococcal vaccine.** / Consult your health care provider.  Hepatitis A vaccine.** / Consult your health care provider.  Hepatitis B vaccine.** / Consult your health care provider.  Haemophilus influenzae type b (Hib) vaccine.** / Consult your health care provider. Ages 27 and over  Blood pressure check.** / Every 1 to 2 years.  Lipid and cholesterol check.**/ Every 5 years beginning at age 46.  Lung cancer screening. / Every year if you are aged 45-80 years and have a 30-pack-year history of smoking and currently smoke or have quit within the past 15 years. Yearly screening is stopped once you have quit smoking for at least 15 years or develop a health problem that would prevent you from having lung cancer treatment.  Fecal occult blood test (FOBT) of stool. / Every year beginning at age 4 and continuing until age 30. You may not have to do this test if you get a colonoscopy every 10 years.  Flexible sigmoidoscopy** or colonoscopy.** / Every 5 years for a flexible sigmoidoscopy or every 10 years for a colonoscopy beginning at age 35 and continuing until age  68.  Hepatitis C blood test.** / For all people born from [birth year] through 1967 and any individual with known risks for hepatitis C.  Abdominal aortic aneurysm (AAA) screening.** / A one-time screening for ages 27 to 30 years who are current or former smokers.  Skin self-exam. / Monthly.  Influenza vaccine. / Every year.  Tetanus, diphtheria, and acellular pertussis (Tdap/Td) vaccine.** / 1 dose of Td every 10 years.  Varicella vaccine.** / Consult your health care provider.  Zoster vaccine.** / 1 dose for adults aged 75 years or older.  Pneumococcal 13-valent conjugate (PCV13) vaccine.** / Consult your health care provider.  Pneumococcal polysaccharide (PPSV23) vaccine.** / 1 dose for all adults aged 51 years and older.  Meningococcal vaccine.** / Consult your health care provider.  Hepatitis A vaccine.** / Consult your health care provider.  Hepatitis B vaccine.** / Consult your health care provider.  Haemophilus influenzae type b (Hib) vaccine.** / Consult your health care provider. **Family history and personal history of risk and conditions may change your health care provider's recommendations. Document Released: 11/01/2001 Document Revised: 09/10/2013 Document Reviewed: 01/31/2011 [facility] Patient Information 2015 [place], [place]. This information is not intended to replace advice given to you by your health care provider. Make sure you discuss any questions you have with your health care provider.

## 2015-05-20 NOTE — Progress Notes (Signed)
Subjective:  Patient ID: George Fitzgerald, male    DOB: [birth year]  Age: 64 y.o. MRN: [id number]  CC: No chief complaint on file.   HPI [name] presents for a well exam. Feeling better. C/o dizziness in am. To address dyslipidemia, HTN, depression.  Outpatient Prescriptions Prior to Visit  Medication Sig Dispense Refill  . aspirin 81 MG tablet Take 81 mg by mouth daily.      Marland Kitchen buPROPion (WELLBUTRIN SR) 100 MG 12 hr tablet Take 1 tablet (100 mg total) by mouth daily. qam 30 tablet 5  . Cholecalciferol (VITAMIN D3) 2000 UNITS capsule Take 2,000 Units by mouth daily.     . Cyanocobalamin (VITAMIN B-12) 1000 MCG SUBL Place 1 tablet (1,000 mcg total) under the tongue daily. 100 tablet 3  . ergocalciferol (VITAMIN D2) 50000 UNITS capsule Take 1 capsule (50,000 Units total) by mouth once a week. 6 capsule 0  . levothyroxine (SYNTHROID, LEVOTHROID) 25 MCG tablet Take 1 tablet (25 mcg total) by mouth daily before breakfast. 30 tablet 11  . loratadine (CLARITIN) 10 MG tablet Take 1 tablet (10 mg total) by mouth daily. 100 tablet 3  . losartan (COZAAR) 100 MG tablet Take 1 tablet (100 mg total) by mouth daily. 30 tablet 11  . omeprazole (PRILOSEC) 20 MG capsule Take 20 mg by mouth daily.    Marland Kitchen atorvastatin (LIPITOR) 10 MG tablet Take 1 tablet (10 mg total) by mouth daily. 90 tablet 1  . ULORIC 80 MG TABS take 1 tablet by mouth once daily 90 tablet 3  . colchicine 0.6 MG tablet Take 1 tablet (0.6 mg total) by mouth 4 (four) times daily as needed. 30 tablet 5   Facility-Administered Medications Prior to Visit  Medication Dose Route Frequency Provider Last Rate Last Dose  . methylPREDNISolone acetate (DEPO-MEDROL) injection 20 mg  20 mg Intra-Lesional Once Yachet Mattson V, MD      . methylPREDNISolone acetate (DEPO-MEDROL) injection 40 mg  40 mg Intra-articular Once Meosha Castanon V, MD        ROS Review of Systems  Constitutional: Negative for appetite change, fatigue and unexpected weight  change.  HENT: Negative for congestion, nosebleeds, sneezing, sore throat and trouble swallowing.   Eyes: Negative for itching and visual disturbance.  Respiratory: Negative for cough.   Cardiovascular: Negative for chest pain, palpitations and leg swelling.  Gastrointestinal: Negative for nausea, diarrhea, blood in stool and abdominal distention.  Genitourinary: Negative for frequency and hematuria.  Musculoskeletal: Negative for back pain, joint swelling, gait problem and neck pain.  Skin: Negative for rash.  Neurological: Negative for dizziness, tremors, speech difficulty and weakness.  Psychiatric/Behavioral: Negative for suicidal ideas, sleep disturbance, dysphoric mood and agitation. The patient is not nervous/anxious.     Objective:  BP 128/88 mmHg  Pulse 74  Ht 6\' 2"  (1.88 m)  Wt 190 lb (86.183 kg)  BMI 24.38 kg/m2  SpO2 97%  BP Readings from Last 3 Encounters:  05/20/15 128/88  05/05/15 190/100  08/20/13 148/86    Wt Readings from Last 3 Encounters:  05/20/15 190 lb (86.183 kg)  05/05/15 192 lb (87.091 kg)  08/20/13 189 lb (85.73 kg)    Physical Exam  Constitutional: He is oriented to person, place, and time. He appears well-developed. No distress.  NAD  HENT:  Mouth/Throat: Oropharynx is clear and moist.  Eyes: Conjunctivae are normal. Pupils are equal, round, and reactive to light.  Neck: Normal range of motion. No JVD present. No thyromegaly present.  Cardiovascular: Normal rate, regular rhythm, normal heart sounds and intact distal pulses.  Exam reveals no gallop and no friction rub.   No murmur heard. Pulmonary/Chest: Effort normal and breath sounds normal. No respiratory distress. He has no wheezes. He has no rales. He exhibits no tenderness.  Abdominal: Soft. Bowel sounds are normal. He exhibits no distension and no mass. There is no tenderness. There is no rebound and no guarding.  Genitourinary: Rectum normal and prostate normal. Guaiac negative stool.    Musculoskeletal: Normal range of motion. He exhibits no edema or tenderness.  Lymphadenopathy:    He has no cervical adenopathy.  Neurological: He is alert and oriented to person, place, and time. He has normal reflexes. No cranial nerve deficit. He exhibits normal muscle tone. He displays a negative Romberg sign. Coordination and gait normal.  Skin: Skin is warm and dry. No rash noted.  Psychiatric: He has a normal mood and affect. His behavior is normal. Judgment and thought content normal.    Lab Results  Component Value Date   WBC 5.4 05/05/2015   HGB 15.0 05/05/2015   HCT 43.5 05/05/2015   PLT 192.0 05/05/2015   GLUCOSE 96 05/05/2015   CHOL 306* 05/05/2015   TRIG 76.0 05/05/2015   HDL 70.30 05/05/2015   LDLDIRECT 131.6 03/15/2012   LDLCALC 220* 05/05/2015   ALT 30 05/05/2015   AST 26 05/05/2015   NA 140 05/05/2015   K 5.2* 05/05/2015   CL 103 05/05/2015   CREATININE 1.05 05/05/2015   BUN 19 05/05/2015   CO2 29 05/05/2015   TSH 4.85* 05/05/2015   PSA 0.98 05/05/2015   HGBA1C 5.4 05/05/2015   EKG NSR No results found.  Assessment & Plan:   Diagnoses and all orders for this visit:  Well adult exam -     EKG 12-Lead  B12 deficiency  Idiopathic gout, unspecified chronicity, unspecified site  Hyperkalemia -     Basic metabolic panel; Future -     Lipid panel; Future  Elevated blood pressure  Dyslipidemia -     Lipid panel; Future  Need for influenza vaccination -     Flu Vaccine QUAD 36+ mos IM  Need for prophylactic vaccination against Streptococcus pneumoniae (pneumococcus) -     Pneumococcal polysaccharide vaccine 23-valent greater than or equal to 2yo subcutaneous/IM  Other orders -     atorvastatin (LIPITOR) 10 MG tablet; Take 1 tablet (10 mg total) by mouth daily. -     Febuxostat (ULORIC) 80 MG TABS; Take 1 tablet (80 mg total) by mouth daily. -     traMADol (ULTRAM) 50 MG tablet; take 1-2 tablets by mouth twice a day if needed for pain maximum  daily dose of 8 -     colchicine 0.6 MG tablet; Take 1 tablet (0.6 mg total) by mouth 4 (four) times daily as needed. -     indomethacin (INDOCIN) 50 MG capsule; Take 1 capsule (50 mg total) by mouth 3 (three) times daily as needed.  I have changed Mr. Agrusa's ULORIC to Febuxostat. I am also having him start on indomethacin. Additionally, I am having him maintain his aspirin, Vitamin D3, omeprazole, loratadine, losartan, buPROPion, ergocalciferol, Vitamin B-12, levothyroxine, B-12, atorvastatin, traMADol, and colchicine. We will continue to administer methylPREDNISolone acetate and methylPREDNISolone acetate.  Meds ordered this encounter  Medications  . Cyanocobalamin (B-12) 1000 MCG LOZG    Sig: Take 1 tablet by mouth daily.    Refill:  0  . atorvastatin (LIPITOR) 10  MG tablet    Sig: Take 1 tablet (10 mg total) by mouth daily.    Dispense:  90 tablet    Refill:  1  . Febuxostat (ULORIC) 80 MG TABS    Sig: Take 1 tablet (80 mg total) by mouth daily.    Dispense:  90 tablet    Refill:  3  . traMADol (ULTRAM) 50 MG tablet    Sig: take 1-2 tablets by mouth twice a day if needed for pain maximum daily dose of 8    Dispense:  60 tablet    Refill:  2  . colchicine 0.6 MG tablet    Sig: Take 1 tablet (0.6 mg total) by mouth 4 (four) times daily as needed.    Dispense:  30 tablet    Refill:  5  . indomethacin (INDOCIN) 50 MG capsule    Sig: Take 1 capsule (50 mg total) by mouth 3 (three) times daily as needed.    Dispense:  60 capsule    Refill:  3     Follow-up: Return in about 3 months (around 08/19/2015) for a follow-up visit.  Walker Kehr, MD

## 2015-05-20 NOTE — Assessment & Plan Note (Signed)
On B12 now

## 2015-06-19 ENCOUNTER — Other Ambulatory Visit (INDEPENDENT_AMBULATORY_CARE_PROVIDER_SITE_OTHER): Payer: Medicare Other

## 2015-06-19 DIAGNOSIS — E785 Hyperlipidemia, unspecified: Secondary | ICD-10-CM | POA: Diagnosis not present

## 2015-06-19 DIAGNOSIS — E875 Hyperkalemia: Secondary | ICD-10-CM | POA: Diagnosis not present

## 2015-06-19 LAB — BASIC METABOLIC PANEL
BUN: 18 mg/dL (ref 6–23)
CHLORIDE: 105 meq/L (ref 96–112)
CO2: 27 mEq/L (ref 19–32)
Calcium: 9.3 mg/dL (ref 8.4–10.5)
Creatinine, Ser: 1.24 mg/dL (ref 0.40–1.50)
GFR: 61.93 mL/min (ref >60.00)
Glucose, Bld: 108 mg/dL — ABNORMAL HIGH (ref 70–99)
POTASSIUM: 4.3 meq/L (ref 3.5–5.1)
SODIUM: 142 meq/L (ref 135–145)

## 2015-06-19 LAB — LIPID PANEL
CHOLESTEROL: 274 mg/dL — AB (ref 0–200)
HDL: 68.8 mg/dL (ref >39.00)
LDL CALC: 175 mg/dL — AB (ref 0–99)
NonHDL: 205.09
TRIGLYCERIDES: 150 mg/dL — AB (ref 0.0–149.0)
Total CHOL/HDL Ratio: 4
VLDL: 30 mg/dL (ref 0.0–40.0)

## 2015-06-19 MED ORDER — ATORVASTATIN CALCIUM 20 MG PO TABS: 20.0000 mg | ORAL_TABLET | Freq: Every day | ORAL | Status: DC

## 2015-06-23 ENCOUNTER — Encounter: Payer: Self-pay | Admitting: Internal Medicine

## 2015-06-26 ENCOUNTER — Other Ambulatory Visit: Payer: Self-pay | Admitting: Internal Medicine

## 2015-06-26 MED ORDER — ALLOPURINOL 300 MG PO TABS: 300.0000 mg | ORAL_TABLET | Freq: Every day | ORAL | Status: DC

## 2015-08-20 ENCOUNTER — Ambulatory Visit (INDEPENDENT_AMBULATORY_CARE_PROVIDER_SITE_OTHER): Payer: Medicare Other | Admitting: Internal Medicine

## 2015-08-20 ENCOUNTER — Encounter: Payer: Self-pay | Admitting: Internal Medicine

## 2015-08-20 ENCOUNTER — Other Ambulatory Visit (INDEPENDENT_AMBULATORY_CARE_PROVIDER_SITE_OTHER): Payer: Medicare Other

## 2015-08-20 ENCOUNTER — Other Ambulatory Visit: Payer: Self-pay | Admitting: Internal Medicine

## 2015-08-20 VITALS — BP 136/76 | HR 62 | Temp 98.1°F | Ht 74.0 in | Wt 191.0 lb

## 2015-08-20 DIAGNOSIS — F4321 Adjustment disorder with depressed mood: Secondary | ICD-10-CM | POA: Diagnosis not present

## 2015-08-20 DIAGNOSIS — F329 Major depressive disorder, single episode, unspecified: Secondary | ICD-10-CM

## 2015-08-20 DIAGNOSIS — IMO0001 Reserved for inherently not codable concepts without codable children: Secondary | ICD-10-CM

## 2015-08-20 DIAGNOSIS — M1000 Idiopathic gout, unspecified site: Secondary | ICD-10-CM | POA: Diagnosis not present

## 2015-08-20 DIAGNOSIS — M109 Gout, unspecified: Secondary | ICD-10-CM

## 2015-08-20 DIAGNOSIS — E538 Deficiency of other specified B group vitamins: Secondary | ICD-10-CM | POA: Diagnosis not present

## 2015-08-20 DIAGNOSIS — R030 Elevated blood-pressure reading, without diagnosis of hypertension: Secondary | ICD-10-CM | POA: Diagnosis not present

## 2015-08-20 DIAGNOSIS — E038 Other specified hypothyroidism: Secondary | ICD-10-CM | POA: Diagnosis not present

## 2015-08-20 DIAGNOSIS — E034 Atrophy of thyroid (acquired): Secondary | ICD-10-CM

## 2015-08-20 DIAGNOSIS — E785 Hyperlipidemia, unspecified: Secondary | ICD-10-CM | POA: Diagnosis not present

## 2015-08-20 DIAGNOSIS — M10012 Idiopathic gout, left shoulder: Secondary | ICD-10-CM

## 2015-08-20 LAB — BASIC METABOLIC PANEL
BUN: 17 mg/dL (ref 6–23)
CO2: 28 mEq/L (ref 19–32)
CREATININE: 1.2 mg/dL (ref 0.40–1.50)
Calcium: 9.7 mg/dL (ref 8.4–10.5)
Chloride: 105 mEq/L (ref 96–112)
GFR: 64.29 mL/min (ref >60.00)
Glucose, Bld: 106 mg/dL — ABNORMAL HIGH (ref 70–99)
Potassium: 4.5 mEq/L (ref 3.5–5.1)
Sodium: 141 mEq/L (ref 135–145)

## 2015-08-20 LAB — TSH: TSH: 5.27 u[IU]/mL — AB (ref 0.35–4.50)

## 2015-08-20 LAB — URIC ACID: Uric Acid, Serum: 8 mg/dL — ABNORMAL HIGH (ref 4.0–7.8)

## 2015-08-20 LAB — LIPID PANEL
Cholesterol: 256 mg/dL — ABNORMAL HIGH (ref 0–200)
HDL: 72.8 mg/dL (ref >39.00)
LDL Cholesterol: 166 mg/dL — ABNORMAL HIGH (ref 0–99)
NonHDL: 183.52
TRIGLYCERIDES: 90 mg/dL (ref 0.0–149.0)
Total CHOL/HDL Ratio: 4
VLDL: 18 mg/dL (ref 0.0–40.0)

## 2015-08-20 MED ORDER — LEVOTHYROXINE SODIUM 50 MCG PO TABS: 50.0000 ug | ORAL_TABLET | Freq: Every day | ORAL | Status: DC

## 2015-08-20 MED ORDER — ATORVASTATIN CALCIUM 40 MG PO TABS: 40.0000 mg | ORAL_TABLET | Freq: Every day | ORAL | Status: DC

## 2015-08-20 NOTE — Patient Instructions (Addendum)
Get a Sinus rinse Flonase

## 2015-08-20 NOTE — Assessment & Plan Note (Signed)
Lipids 

## 2015-08-20 NOTE — Assessment & Plan Note (Signed)
On Wellbutrin Better  

## 2015-08-20 NOTE — Progress Notes (Signed)
Pre visit review using our clinic review tool, if applicable. No additional management support is needed unless otherwise documented below in the visit note. 

## 2015-08-20 NOTE — Assessment & Plan Note (Signed)
TSH 

## 2015-08-20 NOTE — Assessment & Plan Note (Signed)
On B12 

## 2015-08-20 NOTE — Progress Notes (Signed)
Subjective:  Patient ID: George Fitzgerald, male    DOB: 1950/12/10  Age: 64 y.o. MRN: [id number]  CC: No chief complaint on file.   HPI George Fitzgerald presents for HTN, depression, gout f/u - all better  Outpatient Prescriptions Prior to Visit  Medication Sig Dispense Refill  . allopurinol (ZYLOPRIM) 300 MG tablet Take 1 tablet (300 mg total) by mouth daily. 90 tablet 3  . aspirin 81 MG tablet Take 81 mg by mouth daily.      Marland Kitchen atorvastatin (LIPITOR) 20 MG tablet Take 1 tablet (20 mg total) by mouth daily. 90 tablet 3  . buPROPion (WELLBUTRIN SR) 100 MG 12 hr tablet Take 1 tablet (100 mg total) by mouth daily. qam 30 tablet 5  . Cholecalciferol (VITAMIN D3) 2000 UNITS capsule Take 2,000 Units by mouth daily.     . colchicine 0.6 MG tablet Take 1 tablet (0.6 mg total) by mouth 4 (four) times daily as needed. 30 tablet 5  . Cyanocobalamin (B-12) 1000 MCG LOZG Take 1 tablet by mouth daily.  0  . Cyanocobalamin (VITAMIN B-12) 1000 MCG SUBL Place 1 tablet (1,000 mcg total) under the tongue daily. 100 tablet 3  . ergocalciferol (VITAMIN D2) 50000 UNITS capsule Take 1 capsule (50,000 Units total) by mouth once a week. 6 capsule 0  . indomethacin (INDOCIN) 50 MG capsule Take 1 capsule (50 mg total) by mouth 3 (three) times daily as needed. 60 capsule 3  . levothyroxine (SYNTHROID, LEVOTHROID) 25 MCG tablet Take 1 tablet (25 mcg total) by mouth daily before breakfast. 30 tablet 11  . loratadine (CLARITIN) 10 MG tablet Take 1 tablet (10 mg total) by mouth daily. 100 tablet 3  . losartan (COZAAR) 100 MG tablet Take 1 tablet (100 mg total) by mouth daily. 30 tablet 11  . omeprazole (PRILOSEC) 20 MG capsule Take 20 mg by mouth daily.    . traMADol (ULTRAM) 50 MG tablet take 1-2 tablets by mouth twice a day if needed for pain maximum daily dose of 8 60 tablet 2   Facility-Administered Medications Prior to Visit  Medication Dose Route Frequency Provider Last Rate Last Dose  . methylPREDNISolone acetate  (DEPO-MEDROL) injection 20 mg  20 mg Intra-Lesional Once Aleksei Plotnikov V, MD      . methylPREDNISolone acetate (DEPO-MEDROL) injection 40 mg  40 mg Intra-articular Once Aleksei Plotnikov V, MD        ROS Review of Systems  Constitutional: Negative for appetite change, fatigue and unexpected weight change.  HENT: Positive for congestion and rhinorrhea. Negative for nosebleeds, sneezing, sore throat and trouble swallowing.   Eyes: Negative for itching and visual disturbance.  Respiratory: Negative for cough.   Cardiovascular: Negative for chest pain, palpitations and leg swelling.  Gastrointestinal: Negative for nausea, diarrhea, blood in stool and abdominal distention.  Genitourinary: Negative for frequency and hematuria.  Musculoskeletal: Negative for back pain, joint swelling, gait problem and neck pain.  Skin: Negative for rash.  Neurological: Negative for dizziness, tremors, speech difficulty and weakness.  Psychiatric/Behavioral: Negative for suicidal ideas, sleep disturbance, dysphoric mood and agitation. The patient is not nervous/anxious.     Objective:  BP 136/76 mmHg  Pulse 62  Temp(Src) 98.1 F (36.7 C) (Oral)  Ht 6\' 2"  (1.88 m)  Wt 191 lb (86.637 kg)  BMI 24.51 kg/m2  SpO2 97%  BP Readings from Last 3 Encounters:  08/20/15 136/76  05/20/15 128/88  05/05/15 190/100    Wt Readings from Last 3 Encounters:  08/20/15  191 lb (86.637 kg)  05/20/15 190 lb (86.183 kg)  05/05/15 192 lb (87.091 kg)    Physical Exam  Constitutional: He is oriented to person, place, and time. He appears well-developed. No distress.  NAD  HENT:  Mouth/Throat: Oropharynx is clear and moist.  Eyes: Conjunctivae are normal. Pupils are equal, round, and reactive to light.  Neck: Normal range of motion. No JVD present. No thyromegaly present.  Cardiovascular: Normal rate, regular rhythm, normal heart sounds and intact distal pulses.  Exam reveals no gallop and no friction rub.   No murmur  heard. Pulmonary/Chest: Effort normal and breath sounds normal. No respiratory distress. He has no wheezes. He has no rales. He exhibits no tenderness.  Abdominal: Soft. Bowel sounds are normal. He exhibits no distension and no mass. There is no tenderness. There is no rebound and no guarding.  Musculoskeletal: Normal range of motion. He exhibits no edema or tenderness.  Lymphadenopathy:    He has no cervical adenopathy.  Neurological: He is alert and oriented to person, place, and time. He has normal reflexes. No cranial nerve deficit. He exhibits normal muscle tone. He displays a negative Romberg sign. Coordination and gait normal.  Skin: Skin is warm and dry. No rash noted.  Psychiatric: He has a normal mood and affect. His behavior is normal. Judgment and thought content normal.    Lab Results  Component Value Date   WBC 5.4 05/05/2015   HGB 15.0 05/05/2015   HCT 43.5 05/05/2015   PLT 192.0 05/05/2015   GLUCOSE 106* 08/20/2015   CHOL 256* 08/20/2015   TRIG 90.0 08/20/2015   HDL 72.80 08/20/2015   LDLDIRECT 131.6 03/15/2012   LDLCALC 166* 08/20/2015   ALT 30 05/05/2015   AST 26 05/05/2015   NA 141 08/20/2015   K 4.5 08/20/2015   CL 105 08/20/2015   CREATININE 1.20 08/20/2015   BUN 17 08/20/2015   CO2 28 08/20/2015   TSH 5.27* 08/20/2015   PSA 0.98 05/05/2015   HGBA1C 5.4 05/05/2015    No results found.  Assessment & Plan:   Diagnoses and all orders for this visit:  Dyslipidemia -     Lipid panel; Future -     Hepatitis C antibody; Future -     Uric acid; Future -     Basic metabolic panel; Future -     TSH; Future  Hypothyroidism due to acquired atrophy of thyroid -     Lipid panel; Future -     Hepatitis C antibody; Future -     Uric acid; Future -     Basic metabolic panel; Future -     TSH; Future  B12 deficiency -     Lipid panel; Future -     Hepatitis C antibody; Future -     Uric acid; Future -     Basic metabolic panel; Future -     TSH;  Future  Elevated blood pressure -     Lipid panel; Future -     Hepatitis C antibody; Future -     Uric acid; Future -     Basic metabolic panel; Future -     TSH; Future  Gout of left shoulder, unspecified cause, unspecified chronicity -     Lipid panel; Future -     Hepatitis C antibody; Future -     Uric acid; Future -     Basic metabolic panel; Future -     TSH; Future  Reactive depression (  situational) -     Lipid panel; Future -     Hepatitis C antibody; Future -     Uric acid; Future -     Basic metabolic panel; Future -     TSH; Future  I am having Mr. Beer maintain his aspirin, Vitamin D3, omeprazole, loratadine, losartan, buPROPion, ergocalciferol, Vitamin B-12, levothyroxine, B-12, traMADol, colchicine, indomethacin, atorvastatin, and allopurinol. We will continue to administer methylPREDNISolone acetate and methylPREDNISolone acetate.  No orders of the defined types were placed in this encounter.     Follow-up: Return in about 3 months (around 11/18/2015) for a follow-up visit.  Walker Kehr, MD

## 2015-08-20 NOTE — Assessment & Plan Note (Signed)
Much better 

## 2015-08-20 NOTE — Assessment & Plan Note (Addendum)
Labs On Rx Losartan, Uloric, colchicine

## 2015-08-21 LAB — HEPATITIS C ANTIBODY: HCV AB: NEGATIVE

## 2015-11-19 ENCOUNTER — Telehealth: Payer: Medicare Other | Admitting: Physician Assistant

## 2015-11-19 ENCOUNTER — Ambulatory Visit: Payer: Medicare Other | Admitting: Internal Medicine

## 2015-11-19 DIAGNOSIS — B9789 Other viral agents as the cause of diseases classified elsewhere: Principal | ICD-10-CM

## 2015-11-19 DIAGNOSIS — J069 Acute upper respiratory infection, unspecified: Secondary | ICD-10-CM

## 2015-11-19 MED ORDER — BENZONATATE 100 MG PO CAPS: 100.0000 mg | ORAL_CAPSULE | Freq: Two times a day (BID) | ORAL | Status: DC | PRN

## 2015-11-19 NOTE — Progress Notes (Signed)
E visit for Flu like symptoms   We are sorry that you are not feeling well.  Here is how we plan to help! Based on what you have shared with me it looks like you may have a viral upper respiratory infection that does not seem to indicate influenza. Even if influenza were present you are outside the window where Tamiflu can be prescribed (has to be started within 48 hours of symptom onset). Antibiotics do not treat viral illnesses. Please follow supportive measures below. I will send you in a prescription cough medication to help calm that down. Follow-up with Dr. Alain Marion if symptoms are not resolving within 3-4 days or if anything worsens.  Influenza or "the flu" is   an infection caused by a respiratory virus. The flu virus is highly contagious and persons who did not receive their yearly flu vaccination may "catch" the flu from close contact.  We have anti-viral medications to treat the viruses that cause this infection. They are not a "cure" and only shorten the course of the infection. These prescriptions are most effective when they are given within the first 2 days of "flu" symptoms. Antiviral medication are indicated if you have a high risk of complications from the flu.   Based upon your symptoms and potential risk factors I recommend that you follow the flu symptoms recommendation that I have listed below. I have also sent in a prescription for tessalon for your cough.  ANYONE WHO HAS FLU SYMPTOMS SHOULD: . Stay home. The flu is highly contagious and going out or to work exposes others! . Be sure to drink plenty of fluids. Water is fine as well as fruit juices, sodas and electrolyte beverages. You may want to stay away from caffeine or alcohol. If you are nauseated, try taking small sips of liquids. How do you know if you are getting enough fluid? Your urine should be a pale yellow or almost colorless. . Get rest. . Taking a steamy shower or using a humidifier may help nasal congestion and  ease sore throat pain. Using a saline nasal spray works much the same way. . Cough drops, hard candies and sore throat lozenges may ease your cough. . Line up a caregiver. Have someone check on you regularly.   GET HELP RIGHT AWAY IF: . You cannot keep down liquids or your medications. . You become short of breath . Your fell like you are going to pass out or loose consciousness. . Your symptoms persist after you have completed your treatment plan MAKE SURE YOU   Understand these instructions.  Will watch your condition.  Will get help right away if you are not doing well or get worse.  Your e-visit answers were reviewed by a board certified advanced clinical practitioner to complete your personal care plan.  Depending on the condition, your plan could have included both over the counter or prescription medications.  If there is a problem please reply  once you have received a response from your provider.  Your safety is important to [place].  If you have drug allergies check your prescription carefully.    You can use MyChart to ask questions about today's visit, request a non-urgent call back, or ask for a work or school excuse for 24 hours related to this e-Visit. If it has been greater than 24 hours you will need to follow up with your provider, or enter a new e-Visit to address those concerns.  You will get an e-mail in the  next two days asking about your experience.  I hope that your e-visit has been valuable and will speed your recovery. Thank you for using e-visits.

## 2015-11-25 ENCOUNTER — Encounter: Payer: Self-pay | Admitting: Internal Medicine

## 2015-12-17 ENCOUNTER — Ambulatory Visit: Payer: Medicare Other | Admitting: Internal Medicine

## 2016-04-27 DIAGNOSIS — H52223 Regular astigmatism, bilateral: Secondary | ICD-10-CM | POA: Diagnosis not present

## 2016-04-27 DIAGNOSIS — H2513 Age-related nuclear cataract, bilateral: Secondary | ICD-10-CM | POA: Diagnosis not present

## 2016-04-27 DIAGNOSIS — H35033 Hypertensive retinopathy, bilateral: Secondary | ICD-10-CM | POA: Diagnosis not present

## 2016-04-27 DIAGNOSIS — H5203 Hypermetropia, bilateral: Secondary | ICD-10-CM | POA: Diagnosis not present

## 2016-05-05 ENCOUNTER — Encounter: Payer: Self-pay | Admitting: Internal Medicine

## 2016-05-05 ENCOUNTER — Ambulatory Visit (INDEPENDENT_AMBULATORY_CARE_PROVIDER_SITE_OTHER): Payer: Medicare Other | Admitting: Internal Medicine

## 2016-05-05 VITALS — BP 149/89 | HR 72 | Ht 74.0 in | Wt 176.0 lb

## 2016-05-05 DIAGNOSIS — S63259A Unspecified dislocation of unspecified finger, initial encounter: Secondary | ICD-10-CM

## 2016-05-05 DIAGNOSIS — E538 Deficiency of other specified B group vitamins: Secondary | ICD-10-CM

## 2016-05-05 DIAGNOSIS — Z0000 Encounter for general adult medical examination without abnormal findings: Secondary | ICD-10-CM | POA: Diagnosis not present

## 2016-05-05 DIAGNOSIS — E785 Hyperlipidemia, unspecified: Secondary | ICD-10-CM

## 2016-05-05 DIAGNOSIS — R7309 Other abnormal glucose: Secondary | ICD-10-CM | POA: Diagnosis not present

## 2016-05-05 DIAGNOSIS — R030 Elevated blood-pressure reading, without diagnosis of hypertension: Secondary | ICD-10-CM | POA: Diagnosis not present

## 2016-05-05 DIAGNOSIS — M10012 Idiopathic gout, left shoulder: Secondary | ICD-10-CM

## 2016-05-05 DIAGNOSIS — IMO0001 Reserved for inherently not codable concepts without codable children: Secondary | ICD-10-CM

## 2016-05-05 DIAGNOSIS — N320 Bladder-neck obstruction: Secondary | ICD-10-CM

## 2016-05-05 DIAGNOSIS — M109 Gout, unspecified: Secondary | ICD-10-CM

## 2016-05-05 MED ORDER — TRAMADOL HCL 50 MG PO TABS: ORAL_TABLET | ORAL | 2 refills | Status: DC

## 2016-05-05 NOTE — Assessment & Plan Note (Signed)
No relapse after wt loss 

## 2016-05-05 NOTE — Assessment & Plan Note (Signed)
Re-check BP Lost wt

## 2016-05-05 NOTE — Patient Instructions (Addendum)
Goal BP<130/85Preventive Care for Adults, Male A healthy lifestyle and preventive care can promote health and wellness. Preventive health guidelines for men include the following key practices:  A routine yearly physical is a good way to check with your health care provider about your health and preventative screening. It is a chance to share any concerns and updates on your health and to receive a thorough exam.  Visit your dentist for a routine exam and preventative care every 6 months. Brush your teeth twice a day and floss once a day. Good oral hygiene prevents tooth decay and gum disease.  The frequency of eye exams is based on your age, health, family medical history, use of contact lenses, and other factors. Follow your health care provider's recommendations for frequency of eye exams.  Eat a healthy diet. Foods such as vegetables, fruits, whole grains, low-fat dairy products, and lean protein foods contain the nutrients you need without too many calories. Decrease your intake of foods high in solid fats, added sugars, and salt. Eat the right amount of calories for you.Get information about a proper diet from your health care provider, if necessary.  Regular physical exercise is one of the most important things you can do for your health. Most adults should get at least 150 minutes of moderate-intensity exercise (any activity that increases your heart rate and causes you to sweat) each week. In addition, most adults need muscle-strengthening exercises on 2 or more days a week.  Maintain a healthy weight. The body mass index (BMI) is a screening tool to identify possible weight problems. It provides an estimate of body fat based on height and weight. Your health care provider can find your BMI and can help you achieve or maintain a healthy weight.For adults 20 years and older:  A BMI below 18.5 is considered underweight.  A BMI of 18.5 to 24.9 is normal.  A BMI of 25 to 29.9 is considered  overweight.  A BMI of 30 and above is considered obese.  Maintain normal blood lipids and cholesterol levels by exercising and minimizing your intake of saturated fat. Eat a balanced diet with plenty of fruit and vegetables. Blood tests for lipids and cholesterol should begin at age 46 and be repeated every 5 years. If your lipid or cholesterol levels are high, you are over 50, or you are at high risk for heart disease, you may need your cholesterol levels checked more frequently.Ongoing high lipid and cholesterol levels should be treated with medicines if diet and exercise are not working.  If you smoke, find out from your health care provider how to quit. If you do not use tobacco, do not start.  Lung cancer screening is recommended for adults aged 42-80 years who are at high risk for developing lung cancer because of a history of smoking. A yearly low-dose CT scan of the lungs is recommended for people who have at least a 30-pack-year history of smoking and are a current smoker or have quit within the past 15 years. A pack year of smoking is smoking an average of 1 pack of cigarettes a day for 1 year (for example: 1 pack a day for 30 years or 2 packs a day for 15 years). Yearly screening should continue until the smoker has stopped smoking for at least 15 years. Yearly screening should be stopped for people who develop a health problem that would prevent them from having lung cancer treatment.  If you choose to drink alcohol, do not have  than 2 drinks per day. One drink is considered to be 12 ounces (355 mL) of beer, 5 ounces (148 mL) of wine, or 1.5 ounces (44 mL) of liquor.  Avoid use of street drugs. Do not share needles with anyone. Ask for help if you need support or instructions about stopping the use of drugs.  High blood pressure causes heart disease and increases the risk of stroke. Your blood pressure should be checked at least every 1-2 years. Ongoing high blood pressure should be  treated with medicines, if weight loss and exercise are not effective.  If you are 45-79 years old, ask your health care provider if you should take aspirin to prevent heart disease.  Diabetes screening is done by taking a blood sample to check your blood glucose level after you have not eaten for a certain period of time (fasting). If you are not overweight and you do not have risk factors for diabetes, you should be screened once every 3 years starting at age 45. If you are overweight or obese and you are 40-70 years of age, you should be screened for diabetes every year as part of your cardiovascular risk assessment.  Colorectal cancer can be detected and often prevented. Most routine colorectal cancer screening begins at the age of 50 and continues through age 75. However, your health care provider may recommend screening at an earlier age if you have risk factors for colon cancer. On a yearly basis, your health care provider may provide home test kits to check for hidden blood in the stool. Use of a small camera at the end of a tube to directly examine the colon (sigmoidoscopy or colonoscopy) can detect the earliest forms of colorectal cancer. Talk to your health care provider about this at age 50, when routine screening begins. Direct exam of the colon should be repeated every 5-10 years through age 75, unless early forms of precancerous polyps or small growths are found.  People who are at an increased risk for hepatitis B should be screened for this virus. You are considered at high risk for hepatitis B if:  You were born in a country where hepatitis B occurs often. Talk with your health care provider about which countries are considered high risk.  Your parents were born in a high-risk country and you have not received a shot to protect against hepatitis B (hepatitis B vaccine).  You have HIV or AIDS.  You use needles to inject street drugs.  You live with, or have sex with, someone who  has hepatitis B.  You are a man who has sex with other men (MSM).  You get hemodialysis treatment.  You take certain medicines for conditions such as cancer, organ transplantation, and autoimmune conditions.  Hepatitis C blood testing is recommended for all people born from 1945 through 1965 and any individual with known risks for hepatitis C.  Practice safe sex. Use condoms and avoid high-risk sexual practices to reduce the spread of sexually transmitted infections (STIs). STIs include gonorrhea, chlamydia, syphilis, trichomonas, herpes, HPV, and human immunodeficiency virus (HIV). Herpes, HIV, and HPV are viral illnesses that have no cure. They can result in disability, cancer, and death.  If you are a man who has sex with other men, you should be screened at least once per year for:  HIV.  Urethral, rectal, and pharyngeal infection of gonorrhea, chlamydia, or both.  If you are at risk of being infected with HIV, it is recommended that you take a   a prescription medicine daily to prevent HIV infection. This is called preexposure prophylaxis (PrEP). You are considered at risk if:  You are a man who has sex with other men (MSM) and have other risk factors.  You are a heterosexual man, are sexually active, and are at increased risk for HIV infection.  You take drugs by injection.  You are sexually active with a partner who has HIV.  Talk with your health care provider about whether you are at high risk of being infected with HIV. If you choose to begin PrEP, you should first be tested for HIV. You should then be tested every 3 months for as long as you are taking PrEP.  A one-time screening for abdominal aortic aneurysm (AAA) and surgical repair of large AAAs by ultrasound are recommended for men ages 31 to 16 years who are current or former smokers.  Healthy men should no longer receive prostate-specific antigen (PSA) blood tests as part of routine cancer screening. Talk with your health  care provider about prostate cancer screening.  Testicular cancer screening is not recommended for adult males who have no symptoms. Screening includes self-exam, a health care provider exam, and other screening tests. Consult with your health care provider about any symptoms you have or any concerns you have about testicular cancer.  Use sunscreen. Apply sunscreen liberally and repeatedly throughout the day. You should seek shade when your shadow is shorter than you. Protect yourself by wearing long sleeves, pants, a wide-brimmed hat, and sunglasses year round, whenever you are outdoors.  Once a month, do a whole-body skin exam, using a mirror to look at the skin on your back. Tell your health care provider about new moles, moles that have irregular borders, moles that are larger than a pencil eraser, or moles that have changed in shape or color.  Stay current with required vaccines (immunizations).  Influenza vaccine. All adults should be immunized every year.  Tetanus, diphtheria, and acellular pertussis (Td, Tdap) vaccine. An adult who has not previously received Tdap or who does not know his vaccine status should receive 1 dose of Tdap. This initial dose should be followed by tetanus and diphtheria toxoids (Td) booster doses every 10 years. Adults with an unknown or incomplete history of completing a 3-dose immunization series with Td-containing vaccines should begin or complete a primary immunization series including a Tdap dose. Adults should receive a Td booster every 10 years.  Varicella vaccine. An adult without evidence of immunity to varicella should receive 2 doses or a second dose if he has previously received 1 dose.  Human papillomavirus (HPV) vaccine. Males aged 11-21 years who have not received the vaccine previously should receive the 3-dose series. Males aged 22-26 years may be immunized. Immunization is recommended through the age of 45 years for any male who has sex with males  and did not get any or all doses earlier. Immunization is recommended for any person with an immunocompromised condition through the age of 31 years if he did not get any or all doses earlier. During the 3-dose series, the second dose should be obtained 4-8 weeks after the first dose. The third dose should be obtained 24 weeks after the first dose and 16 weeks after the second dose.  Zoster vaccine. One dose is recommended for adults aged 26 years or older unless certain conditions are present.  Measles, mumps, and rubella (MMR) vaccine. Adults born before [birth year] generally are considered immune to measles and mumps. Adults born in  1959 or later should have 1 or more doses of MMR vaccine unless there is a contraindication to the vaccine or there is laboratory evidence of immunity to each of the three diseases. A routine second dose of MMR vaccine should be obtained at least 28 days after the first dose for students attending postsecondary schools, health care workers, or international travelers. People who received inactivated measles vaccine or an unknown type of measles vaccine during 1963-1967 should receive 2 doses of MMR vaccine. People who received inactivated mumps vaccine or an unknown type of mumps vaccine before 1979 and are at high risk for mumps infection should consider immunization with 2 doses of MMR vaccine. Unvaccinated health care workers born before [birth year] who lack laboratory evidence of measles, mumps, or rubella immunity or laboratory confirmation of disease should consider measles and mumps immunization with 2 doses of MMR vaccine or rubella immunization with 1 dose of MMR vaccine.  Pneumococcal 13-valent conjugate (PCV13) vaccine. When indicated, a person who is uncertain of his immunization history and has no record of immunization should receive the PCV13 vaccine. All adults 42 years of age and older should receive this vaccine. An adult aged 90 years or older who has certain medical  conditions and has not been previously immunized should receive 1 dose of PCV13 vaccine. This PCV13 should be followed with a dose of pneumococcal polysaccharide (PPSV23) vaccine. Adults who are at high risk for pneumococcal disease should obtain the PPSV23 vaccine at least 8 weeks after the dose of PCV13 vaccine. Adults older than 67 years of age who have normal immune system function should obtain the PPSV23 vaccine dose at least 1 year after the dose of PCV13 vaccine.  Pneumococcal polysaccharide (PPSV23) vaccine. When PCV13 is also indicated, PCV13 should be obtained first. All adults aged 70 years and older should be immunized. An adult younger than age 71 years who has certain medical conditions should be immunized. Any person who resides in a nursing home or long-term care facility should be immunized. An adult smoker should be immunized. People with an immunocompromised condition and certain other conditions should receive both PCV13 and PPSV23 vaccines. People with human immunodeficiency virus (HIV) infection should be immunized as soon as possible after diagnosis. Immunization during chemotherapy or radiation therapy should be avoided. Routine use of PPSV23 vaccine is not recommended for American Indians, [place] Natives, or people younger than 65 years unless there are medical conditions that require PPSV23 vaccine. When indicated, people who have unknown immunization and have no record of immunization should receive PPSV23 vaccine. One-time revaccination 5 years after the first dose of PPSV23 is recommended for people aged 19-64 years who have chronic kidney failure, nephrotic syndrome, asplenia, or immunocompromised conditions. People who received 1-2 doses of PPSV23 before age 77 years should receive another dose of PPSV23 vaccine at age 59 years or later if at least 5 years have passed since the previous dose. Doses of PPSV23 are not needed for people immunized with PPSV23 at or after age 61  years.  Meningococcal vaccine. Adults with asplenia or persistent complement component deficiencies should receive 2 doses of quadrivalent meningococcal conjugate (MenACWY-D) vaccine. The doses should be obtained at least 2 months apart. Microbiologists working with certain meningococcal bacteria, [employer] recruits, people at risk during an outbreak, and people who travel to or live in countries with a high rate of meningitis should be immunized. A first-year college student up through age 48 years who is living in a residence hall should receive  a dose if he did not receive a dose on or after his 16th birthday. Adults who have certain high-risk conditions should receive one or more doses of vaccine.  Hepatitis A vaccine. Adults who wish to be protected from this disease, have chronic liver disease, work with hepatitis A-infected animals, work in hepatitis A research labs, or travel to or work in countries with a high rate of hepatitis A should be immunized. Adults who were previously unvaccinated and who anticipate close contact with an international adoptee during the first 60 days after arrival in the [place] from a country with a high rate of hepatitis A should be immunized.  Hepatitis B vaccine. Adults should be immunized if they wish to be protected from this disease, are under age 91 years and have diabetes, have chronic liver disease, have had more than one sex partner in the past 6 months, may be exposed to blood or other infectious body fluids, are household contacts or sex partners of hepatitis B positive people, are clients or workers in certain care facilities, or travel to or work in countries with a high rate of hepatitis B.  Haemophilus influenzae type b (Hib) vaccine. A previously unvaccinated person with asplenia or sickle cell disease or having a scheduled splenectomy should receive 1 dose of Hib vaccine. Regardless of previous immunization, a recipient of a hematopoietic stem cell  transplant should receive a 3-dose series 6-12 months after his successful transplant. Hib vaccine is not recommended for adults with HIV infection. Preventive Service / Frequency Ages 82 to 41  Blood pressure check.** / Every 3-5 years.  Lipid and cholesterol check.** / Every 5 years beginning at age 45.  Hepatitis C blood test.** / For any individual with known risks for hepatitis C.  Skin self-exam. / Monthly.  Influenza vaccine. / Every year.  Tetanus, diphtheria, and acellular pertussis (Tdap, Td) vaccine.** / Consult your health care provider. 1 dose of Td every 10 years.  Varicella vaccine.** / Consult your health care provider.  HPV vaccine. / 3 doses over 6 months, if 72 or younger.  Measles, mumps, rubella (MMR) vaccine.** / You need at least 1 dose of MMR if you were born in 1957 or later. You may also need a second dose.  Pneumococcal 13-valent conjugate (PCV13) vaccine.** / Consult your health care provider.  Pneumococcal polysaccharide (PPSV23) vaccine.** / 1 to 2 doses if you smoke cigarettes or if you have certain conditions.  Meningococcal vaccine.** / 1 dose if you are age 69 to 66 years and a Market researcher living in a residence hall, or have one of several medical conditions. You may also need additional booster doses.  Hepatitis A vaccine.** / Consult your health care provider.  Hepatitis B vaccine.** / Consult your health care provider.  Haemophilus influenzae type b (Hib) vaccine.** / Consult your health care provider. Ages 71 to 25  Blood pressure check.** / Every year.  Lipid and cholesterol check.** / Every 5 years beginning at age 29.  Lung cancer screening. / Every year if you are aged 37-80 years and have a 30-pack-year history of smoking and currently smoke or have quit within the past 15 years. Yearly screening is stopped once you have quit smoking for at least 15 years or develop a health problem that would prevent you from having  lung cancer treatment.  Fecal occult blood test (FOBT) of stool. / Every year beginning at age 54 and continuing until age 15. You may not have to  this test if you get a colonoscopy every 10 years.  Flexible sigmoidoscopy** or colonoscopy.** / Every 5 years for a flexible sigmoidoscopy or every 10 years for a colonoscopy beginning at age 50 and continuing until age 75.  Hepatitis C blood test.** / For all people born from 1945 through 1965 and any individual with known risks for hepatitis C.  Skin self-exam. / Monthly.  Influenza vaccine. / Every year.  Tetanus, diphtheria, and acellular pertussis (Tdap/Td) vaccine.** / Consult your health care provider. 1 dose of Td every 10 years.  Varicella vaccine.** / Consult your health care provider.  Zoster vaccine.** / 1 dose for adults aged 60 years or older.  Measles, mumps, rubella (MMR) vaccine.** / You need at least 1 dose of MMR if you were born in 1957 or later. You may also need a second dose.  Pneumococcal 13-valent conjugate (PCV13) vaccine.** / Consult your health care provider.  Pneumococcal polysaccharide (PPSV23) vaccine.** / 1 to 2 doses if you smoke cigarettes or if you have certain conditions.  Meningococcal vaccine.** / Consult your health care provider.  Hepatitis A vaccine.** / Consult your health care provider.  Hepatitis B vaccine.** / Consult your health care provider.  Haemophilus influenzae type b (Hib) vaccine.** / Consult your health care provider. Ages 65 and over  Blood pressure check.** / Every year.  Lipid and cholesterol check.**/ Every 5 years beginning at age 20.  Lung cancer screening. / Every year if you are aged 55-80 years and have a 30-pack-year history of smoking and currently smoke or have quit within the past 15 years. Yearly screening is stopped once you have quit smoking for at least 15 years or develop a health problem that would prevent you from having lung cancer treatment.  Fecal  occult blood test (FOBT) of stool. / Every year beginning at age 50 and continuing until age 75. You may not have to do this test if you get a colonoscopy every 10 years.  Flexible sigmoidoscopy** or colonoscopy.** / Every 5 years for a flexible sigmoidoscopy or every 10 years for a colonoscopy beginning at age 50 and continuing until age 75.  Hepatitis C blood test.** / For all people born from 1945 through 1965 and any individual with known risks for hepatitis C.  Abdominal aortic aneurysm (AAA) screening.** / A one-time screening for ages 65 to 75 years who are current or former smokers.  Skin self-exam. / Monthly.  Influenza vaccine. / Every year.  Tetanus, diphtheria, and acellular pertussis (Tdap/Td) vaccine.** / 1 dose of Td every 10 years.  Varicella vaccine.** / Consult your health care provider.  Zoster vaccine.** / 1 dose for adults aged 60 years or older.  Pneumococcal 13-valent conjugate (PCV13) vaccine.** / 1 dose for all adults aged 65 years and older.  Pneumococcal polysaccharide (PPSV23) vaccine.** / 1 dose for all adults aged 65 years and older.  Meningococcal vaccine.** / Consult your health care provider.  Hepatitis A vaccine.** / Consult your health care provider.  Hepatitis B vaccine.** / Consult your health care provider.  Haemophilus influenzae type b (Hib) vaccine.** / Consult your health care provider. **Family history and personal history of risk and conditions may change your health care provider's recommendations.   This information is not intended to replace advice given to you by your health care provider. Make sure you discuss any questions you have with your health care provider.   Document Released: 11/01/2001 Document Revised: 09/26/2014 Document Reviewed: 01/31/2011 Elsevier Interactive Patient Education 2016   2016 [employer].

## 2016-05-05 NOTE — Progress Notes (Signed)
Subjective:  Patient ID: George Fitzgerald, male    DOB: 31-Jul-1951  Age: 65 y.o. MRN: [id number]  CC: No chief complaint on file.   HPI George Fitzgerald presents for gout, HTN, B12 def f/u. Well exam C/o R 5th finger dislocation 3 mo ago - it hurts. Competitive yaht racing 62 ft  Outpatient Medications Prior to Visit  Medication Sig Dispense Refill  . aspirin 81 MG tablet Take 81 mg by mouth daily.      . Cholecalciferol (VITAMIN D3) 2000 UNITS capsule Take 2,000 Units by mouth daily.     . colchicine 0.6 MG tablet Take 1 tablet (0.6 mg total) by mouth 4 (four) times daily as needed. 30 tablet 5  . Cyanocobalamin (VITAMIN B-12) 1000 MCG SUBL Place 1 tablet (1,000 mcg total) under the tongue daily. 100 tablet 3  . traMADol (ULTRAM) 50 MG tablet take 1-2 tablets by mouth twice a day if needed for pain maximum daily dose of 8 60 tablet 2  . allopurinol (ZYLOPRIM) 300 MG tablet Take 1 tablet (300 mg total) by mouth daily. (Patient not taking: Reported on 05/05/2016) 90 tablet 3  . atorvastatin (LIPITOR) 40 MG tablet Take 1 tablet (40 mg total) by mouth daily. (Patient not taking: Reported on 05/05/2016) 90 tablet 3  . benzonatate (TESSALON) 100 MG capsule Take 1 capsule (100 mg total) by mouth 2 (two) times daily as needed for cough. (Patient not taking: Reported on 05/05/2016) 30 capsule 0  . buPROPion (WELLBUTRIN SR) 100 MG 12 hr tablet Take 1 tablet (100 mg total) by mouth daily. qam (Patient not taking: Reported on 05/05/2016) 30 tablet 5  . indomethacin (INDOCIN) 50 MG capsule Take 1 capsule (50 mg total) by mouth 3 (three) times daily as needed. (Patient not taking: Reported on 05/05/2016) 60 capsule 3  . levothyroxine (SYNTHROID, LEVOTHROID) 50 MCG tablet Take 1 tablet (50 mcg total) by mouth daily. (Patient not taking: Reported on 05/05/2016) 90 tablet 3  . loratadine (CLARITIN) 10 MG tablet Take 1 tablet (10 mg total) by mouth daily. (Patient not taking: Reported on 05/05/2016) 100 tablet 3  . losartan  (COZAAR) 100 MG tablet Take 1 tablet (100 mg total) by mouth daily. (Patient not taking: Reported on 05/05/2016) 30 tablet 11  . omeprazole (PRILOSEC) 20 MG capsule Take 20 mg by mouth daily.    . ergocalciferol (VITAMIN D2) 50000 UNITS capsule Take 1 capsule (50,000 Units total) by mouth once a week. (Patient not taking: Reported on 05/05/2016) 6 capsule 0   Facility-Administered Medications Prior to Visit  Medication Dose Route Frequency Provider Last Rate Last Dose  . methylPREDNISolone acetate (DEPO-MEDROL) injection 20 mg  20 mg Intra-Lesional Once Cassandria Anger, MD      . methylPREDNISolone acetate (DEPO-MEDROL) injection 40 mg  40 mg Intra-articular Once Cassandria Anger, MD        ROS Review of Systems  Constitutional: Negative for appetite change, fatigue and unexpected weight change.  HENT: Negative for congestion, nosebleeds, sneezing, sore throat and trouble swallowing.   Eyes: Negative for itching and visual disturbance.  Respiratory: Negative for cough.   Cardiovascular: Negative for chest pain, palpitations and leg swelling.  Gastrointestinal: Negative for abdominal distention, blood in stool, diarrhea and nausea.  Genitourinary: Negative for frequency and hematuria.  Musculoskeletal: Negative for back pain, gait problem, joint swelling and neck pain.  Skin: Negative for rash.  Neurological: Negative for dizziness, tremors, speech difficulty and weakness.  Psychiatric/Behavioral: Negative for agitation, dysphoric mood  and sleep disturbance. The patient is not nervous/anxious.     Objective:  BP (!) 168/90   Pulse 72   Ht 6\' 2"  (1.88 m)   Wt 176 lb (79.8 kg)   SpO2 97%   BMI 22.60 kg/m   BP Readings from Last 3 Encounters:  05/05/16 (!) 168/90  08/20/15 136/76  05/20/15 128/88    Wt Readings from Last 3 Encounters:  05/05/16 176 lb (79.8 kg)  08/20/15 191 lb (86.6 kg)  05/20/15 190 lb (86.2 kg)    Physical Exam  Constitutional: He is oriented to  person, place, and time. He appears well-developed and well-nourished. No distress.  HENT:  Head: Normocephalic and atraumatic.  Right Ear: External ear normal.  Left Ear: External ear normal.  Nose: Nose normal.  Mouth/Throat: Oropharynx is clear and moist. No oropharyngeal exudate.  Eyes: Conjunctivae and EOM are normal. Pupils are equal, round, and reactive to light. Right eye exhibits no discharge. Left eye exhibits no discharge. No scleral icterus.  Neck: Normal range of motion. Neck supple. No JVD present. No tracheal deviation present. No thyromegaly present.  Cardiovascular: Normal rate, regular rhythm, normal heart sounds and intact distal pulses.  Exam reveals no gallop and no friction rub.   No murmur heard. Pulmonary/Chest: Effort normal and breath sounds normal. No stridor. No respiratory distress. He has no wheezes. He has no rales. He exhibits no tenderness.  Abdominal: Soft. Bowel sounds are normal. He exhibits no distension and no mass. There is no tenderness. There is no rebound and no guarding.  Genitourinary: Rectum normal, prostate normal and penis normal. Rectal exam shows guaiac negative stool. No penile tenderness.  Musculoskeletal: Normal range of motion. He exhibits no edema or tenderness.  Lymphadenopathy:    He has no cervical adenopathy.  Neurological: He is alert and oriented to person, place, and time. He has normal reflexes. No cranial nerve deficit. He exhibits normal muscle tone. Coordination normal.  Skin: Skin is warm and dry. No rash noted. He is not diaphoretic. No erythema. No pallor.  Psychiatric: He has a normal mood and affect. His behavior is normal. Judgment and thought content normal.   R 5th finger dislocation 3 mo ago - ace wrap  Lab Results  Component Value Date   WBC 5.4 05/05/2015   HGB 15.0 05/05/2015   HCT 43.5 05/05/2015   PLT 192.0 05/05/2015   GLUCOSE 106 (H) 08/20/2015   CHOL 256 (H) 08/20/2015   TRIG 90.0 08/20/2015   HDL 72.80  08/20/2015   LDLDIRECT 131.6 03/15/2012   LDLCALC 166 (H) 08/20/2015   ALT 30 05/05/2015   AST 26 05/05/2015   NA 141 08/20/2015   K 4.5 08/20/2015   CL 105 08/20/2015   CREATININE 1.20 08/20/2015   BUN 17 08/20/2015   CO2 28 08/20/2015   TSH 5.27 (H) 08/20/2015   PSA 0.98 05/05/2015   HGBA1C 5.4 05/05/2015    No results found.  Assessment & Plan:   There are no diagnoses linked to this encounter. I have discontinued Mr. Bartl's ergocalciferol. I am also having him maintain his aspirin, Vitamin D3, omeprazole, loratadine, losartan, buPROPion, Vitamin B-12, traMADol, colchicine, indomethacin, allopurinol, levothyroxine, atorvastatin, and benzonatate. We will continue to administer methylPREDNISolone acetate and methylPREDNISolone acetate.  No orders of the defined types were placed in this encounter.    Follow-up: No Follow-up on file.  Walker Kehr, MD

## 2016-05-05 NOTE — Assessment & Plan Note (Signed)
On B12 

## 2016-05-05 NOTE — Progress Notes (Signed)
Pre visit review using our clinic review tool, if applicable. No additional management support is needed unless otherwise documented below in the visit note. 

## 2016-05-05 NOTE — Assessment & Plan Note (Signed)
R 5th finger dislocation 3 mo ago  ACE wrap Dr Tamala Julian

## 2016-05-05 NOTE — Assessment & Plan Note (Signed)
Labs - lost wt

## 2016-05-05 NOTE — Assessment & Plan Note (Signed)
Here for medicare wellness/physical  Diet: heart healthy  Physical activity: not sedentary  Depression/mood screen: negative  Hearing: intact to whispered voice  Visual acuity: grossly normal, performs annual eye exam  ADLs: capable  Fall risk: none  Home safety: good  Cognitive evaluation: intact to orientation, naming, recall and repetition  EOL planning: adv directives, full code/ I agree  I have personally reviewed and have noted  1. The patient's medical, surgical and social history  2. Their use of alcohol, tobacco or illicit drugs  3. Their current medications and supplements  4. The patient's functional ability including ADL's, fall risks, home safety risks and hearing or visual impairment.  5. Diet and physical activities  6. Evidence for depression or mood disorders 7. The roster of all physicians providing medical care to patient - is listed in the Snapshot section of the chart and reviewed today.    Today patient counseled on age appropriate routine health concerns for screening and prevention, each reviewed and up to date or declined. Immunizations reviewed and up to date or declined. Labs ordered and reviewed. Risk factors for depression reviewed and negative. Hearing function and visual acuity are intact. ADLs screened and addressed as needed. Functional ability and level of safety reviewed and appropriate. Education, counseling and referrals performed based on assessed risks today. Patient provided with a copy of personalized plan for preventive services.   Colon due in 2020

## 2016-05-11 ENCOUNTER — Other Ambulatory Visit (INDEPENDENT_AMBULATORY_CARE_PROVIDER_SITE_OTHER): Payer: Medicare Other

## 2016-05-11 DIAGNOSIS — E785 Hyperlipidemia, unspecified: Secondary | ICD-10-CM

## 2016-05-11 DIAGNOSIS — IMO0001 Reserved for inherently not codable concepts without codable children: Secondary | ICD-10-CM

## 2016-05-11 DIAGNOSIS — Z0000 Encounter for general adult medical examination without abnormal findings: Secondary | ICD-10-CM

## 2016-05-11 DIAGNOSIS — N320 Bladder-neck obstruction: Secondary | ICD-10-CM | POA: Diagnosis not present

## 2016-05-11 DIAGNOSIS — R030 Elevated blood-pressure reading, without diagnosis of hypertension: Secondary | ICD-10-CM

## 2016-05-11 DIAGNOSIS — E538 Deficiency of other specified B group vitamins: Secondary | ICD-10-CM | POA: Diagnosis not present

## 2016-05-11 DIAGNOSIS — R7309 Other abnormal glucose: Secondary | ICD-10-CM | POA: Diagnosis not present

## 2016-05-11 DIAGNOSIS — M10012 Idiopathic gout, left shoulder: Secondary | ICD-10-CM

## 2016-05-11 DIAGNOSIS — Z1159 Encounter for screening for other viral diseases: Secondary | ICD-10-CM | POA: Diagnosis not present

## 2016-05-11 DIAGNOSIS — S63259A Unspecified dislocation of unspecified finger, initial encounter: Secondary | ICD-10-CM | POA: Diagnosis not present

## 2016-05-11 DIAGNOSIS — M109 Gout, unspecified: Secondary | ICD-10-CM

## 2016-05-11 LAB — HEPATIC FUNCTION PANEL
ALK PHOS: 65 U/L (ref 39–117)
ALT: 22 U/L (ref 0–53)
AST: 24 U/L (ref 0–37)
Albumin: 4.8 g/dL (ref 3.5–5.2)
BILIRUBIN DIRECT: 0.1 mg/dL (ref 0.0–0.3)
BILIRUBIN TOTAL: 0.5 mg/dL (ref 0.2–1.2)
Total Protein: 7.2 g/dL (ref 6.0–8.3)

## 2016-05-11 LAB — BASIC METABOLIC PANEL
BUN: 27 mg/dL — ABNORMAL HIGH (ref 6–23)
CALCIUM: 9.4 mg/dL (ref 8.4–10.5)
CO2: 28 meq/L (ref 19–32)
CREATININE: 1.2 mg/dL (ref 0.40–1.50)
Chloride: 102 mEq/L (ref 96–112)
GFR: 64.14 mL/min (ref >60.00)
Glucose, Bld: 105 mg/dL — ABNORMAL HIGH (ref 70–99)
Potassium: 5.6 mEq/L — ABNORMAL HIGH (ref 3.5–5.1)
Sodium: 139 mEq/L (ref 135–145)

## 2016-05-11 LAB — CBC WITH DIFFERENTIAL/PLATELET
BASOS PCT: 1.8 % (ref 0.0–3.0)
Basophils Absolute: 0.1 10*3/uL (ref 0.0–0.1)
EOS ABS: 0.1 10*3/uL (ref 0.0–0.7)
Eosinophils Relative: 3.4 % (ref 0.0–5.0)
HCT: 40.4 % (ref 39.0–52.0)
HEMOGLOBIN: 13.9 g/dL (ref 13.0–17.0)
LYMPHS ABS: 1.3 10*3/uL (ref 0.7–4.0)
Lymphocytes Relative: 30.4 % (ref 12.0–46.0)
MCHC: 34.5 g/dL (ref 30.0–36.0)
MCV: 100.3 fl — ABNORMAL HIGH (ref 78.0–100.0)
MONO ABS: 0.6 10*3/uL (ref 0.1–1.0)
Monocytes Relative: 14.2 % — ABNORMAL HIGH (ref 3.0–12.0)
NEUTROS ABS: 2.1 10*3/uL (ref 1.4–7.7)
NEUTROS PCT: 50.2 % (ref 43.0–77.0)
PLATELETS: 208 10*3/uL (ref 150.0–400.0)
RBC: 4.03 Mil/uL — ABNORMAL LOW (ref 4.22–5.81)
RDW: 13.4 % (ref 11.5–15.5)
WBC: 4.2 10*3/uL (ref 4.0–10.5)

## 2016-05-11 LAB — LIPID PANEL
CHOL/HDL RATIO: 3
Cholesterol: 254 mg/dL — ABNORMAL HIGH (ref 0–200)
HDL: 97.4 mg/dL (ref >39.00)
LDL Cholesterol: 140 mg/dL — ABNORMAL HIGH (ref 0–99)
NONHDL: 156.14
Triglycerides: 80 mg/dL (ref 0.0–149.0)
VLDL: 16 mg/dL (ref 0.0–40.0)

## 2016-05-11 LAB — URINALYSIS
Bilirubin Urine: NEGATIVE
HGB URINE DIPSTICK: NEGATIVE
Ketones, ur: NEGATIVE
LEUKOCYTES UA: NEGATIVE
Nitrite: NEGATIVE
SPECIFIC GRAVITY, URINE: 1.015 (ref 1.000–1.030)
Total Protein, Urine: NEGATIVE
UROBILINOGEN UA: 1 (ref 0.0–1.0)
Urine Glucose: NEGATIVE
pH: 6 (ref 5.0–8.0)

## 2016-05-11 LAB — HEMOGLOBIN A1C: HEMOGLOBIN A1C: 5.3 % (ref 4.6–6.5)

## 2016-05-11 LAB — VITAMIN B12: Vitamin B-12: 470 pg/mL (ref 211–911)

## 2016-05-11 LAB — URIC ACID: URIC ACID, SERUM: 9.2 mg/dL — AB (ref 4.0–7.8)

## 2016-05-11 LAB — TESTOSTERONE: TESTOSTERONE: 368.42 ng/dL (ref 300.00–890.00)

## 2016-05-11 LAB — TSH: TSH: 5.46 u[IU]/mL — AB (ref 0.35–4.50)

## 2016-05-11 LAB — PSA: PSA: 0.68 ng/mL (ref 0.10–4.00)

## 2016-05-12 ENCOUNTER — Other Ambulatory Visit: Payer: Self-pay | Admitting: Internal Medicine

## 2016-05-12 DIAGNOSIS — E785 Hyperlipidemia, unspecified: Secondary | ICD-10-CM

## 2016-05-12 LAB — HEPATITIS C ANTIBODY: HCV Ab: NEGATIVE

## 2016-05-12 MED ORDER — ALLOPURINOL 300 MG PO TABS: 300.0000 mg | ORAL_TABLET | Freq: Every day | ORAL | 3 refills | Status: DC

## 2016-05-13 ENCOUNTER — Encounter: Payer: Self-pay | Admitting: Internal Medicine

## 2016-07-25 ENCOUNTER — Encounter: Payer: Self-pay | Admitting: Internal Medicine

## 2016-08-03 ENCOUNTER — Encounter: Payer: Self-pay | Admitting: Internal Medicine

## 2016-08-03 ENCOUNTER — Ambulatory Visit (INDEPENDENT_AMBULATORY_CARE_PROVIDER_SITE_OTHER): Payer: Medicare Other | Admitting: Internal Medicine

## 2016-08-03 DIAGNOSIS — F329 Major depressive disorder, single episode, unspecified: Secondary | ICD-10-CM

## 2016-08-03 DIAGNOSIS — S63259A Unspecified dislocation of unspecified finger, initial encounter: Secondary | ICD-10-CM

## 2016-08-03 DIAGNOSIS — M109 Gout, unspecified: Secondary | ICD-10-CM

## 2016-08-03 DIAGNOSIS — E538 Deficiency of other specified B group vitamins: Secondary | ICD-10-CM

## 2016-08-03 DIAGNOSIS — E034 Atrophy of thyroid (acquired): Secondary | ICD-10-CM

## 2016-08-03 DIAGNOSIS — E559 Vitamin D deficiency, unspecified: Secondary | ICD-10-CM

## 2016-08-03 NOTE — Assessment & Plan Note (Signed)
Doing better.   

## 2016-08-03 NOTE — Progress Notes (Signed)
Subjective:  Patient ID: George Fitzgerald, male    DOB: Jun 19, 1951  Age: 65 y.o. MRN: [id number]  CC: No chief complaint on file.   HPI [name] presents for gout, dyslipidemia, R 5th finger dislocation f/u. C/o R knee pain  Outpatient Medications Prior to Visit  Medication Sig Dispense Refill  . allopurinol (ZYLOPRIM) 300 MG tablet Take 1 tablet (300 mg total) by mouth daily. 90 tablet 3  . aspirin 81 MG tablet Take 81 mg by mouth daily.      Marland Kitchen atorvastatin (LIPITOR) 40 MG tablet Take 1 tablet (40 mg total) by mouth daily. 90 tablet 3  . benzonatate (TESSALON) 100 MG capsule Take 1 capsule (100 mg total) by mouth 2 (two) times daily as needed for cough. 30 capsule 0  . buPROPion (WELLBUTRIN SR) 100 MG 12 hr tablet Take 1 tablet (100 mg total) by mouth daily. qam 30 tablet 5  . Cholecalciferol (VITAMIN D3) 2000 UNITS capsule Take 2,000 Units by mouth daily.     . Cyanocobalamin (VITAMIN B-12) 1000 MCG SUBL Place 1 tablet (1,000 mcg total) under the tongue daily. 100 tablet 3  . indomethacin (INDOCIN) 50 MG capsule Take 1 capsule (50 mg total) by mouth 3 (three) times daily as needed. 60 capsule 3  . levothyroxine (SYNTHROID, LEVOTHROID) 50 MCG tablet Take 1 tablet (50 mcg total) by mouth daily. 90 tablet 3  . loratadine (CLARITIN) 10 MG tablet Take 1 tablet (10 mg total) by mouth daily. 100 tablet 3  . losartan (COZAAR) 100 MG tablet Take 1 tablet (100 mg total) by mouth daily. 30 tablet 11  . omeprazole (PRILOSEC) 20 MG capsule Take 20 mg by mouth daily.    . traMADol (ULTRAM) 50 MG tablet take 1-2 tablets by mouth twice a day if needed for pain maximum daily dose of 8 60 tablet 2  . colchicine 0.6 MG tablet Take 1 tablet (0.6 mg total) by mouth 4 (four) times daily as needed. 30 tablet 5   Facility-Administered Medications Prior to Visit  Medication Dose Route Frequency Provider Last Rate Last Dose  . methylPREDNISolone acetate (DEPO-MEDROL) injection 20 mg  20 mg Intra-Lesional Once  Cassandria Anger, MD      . methylPREDNISolone acetate (DEPO-MEDROL) injection 40 mg  40 mg Intra-articular Once Cassandria Anger, MD        ROS Review of Systems  Constitutional: Negative for appetite change, fatigue and unexpected weight change.  HENT: Negative for congestion, nosebleeds, sneezing, sore throat and trouble swallowing.   Eyes: Negative for itching and visual disturbance.  Respiratory: Negative for cough.   Cardiovascular: Negative for chest pain, palpitations and leg swelling.  Gastrointestinal: Negative for abdominal distention, blood in stool, diarrhea and nausea.  Genitourinary: Negative for frequency and hematuria.  Musculoskeletal: Positive for arthralgias. Negative for back pain, gait problem, joint swelling and neck pain.  Skin: Negative for rash.  Neurological: Negative for dizziness, tremors, speech difficulty and weakness.  Psychiatric/Behavioral: Negative for agitation, dysphoric mood, sleep disturbance and suicidal ideas. The patient is not nervous/anxious.     Objective:  BP 138/80   Pulse 67   Temp 98.2 F (36.8 C) (Oral)   Resp 20   Wt 179 lb (81.2 kg)   SpO2 98%   BMI 22.98 kg/m   BP Readings from Last 3 Encounters:  08/03/16 138/80  05/05/16 (!) 149/89  08/20/15 136/76    Wt Readings from Last 3 Encounters:  08/03/16 179 lb (81.2 kg)  05/05/16  176 lb (79.8 kg)  08/20/15 191 lb (86.6 kg)    Physical Exam  Constitutional: He is oriented to person, place, and time. He appears well-developed and well-nourished. No distress.  HENT:  Head: Normocephalic and atraumatic.  Right Ear: External ear normal.  Left Ear: External ear normal.  Nose: Nose normal.  Mouth/Throat: Oropharynx is clear and moist. No oropharyngeal exudate.  Eyes: Conjunctivae and EOM are normal. Pupils are equal, round, and reactive to light. Right eye exhibits no discharge. Left eye exhibits no discharge. No scleral icterus.  Neck: Normal range of motion. Neck  supple. No JVD present. No tracheal deviation present. No thyromegaly present.  Cardiovascular: Normal rate, regular rhythm, normal heart sounds and intact distal pulses.  Exam reveals no gallop and no friction rub.   No murmur heard. Pulmonary/Chest: Effort normal and breath sounds normal. No stridor. No respiratory distress. He has no wheezes. He has no rales. He exhibits no tenderness.  Abdominal: Soft. Bowel sounds are normal. He exhibits no distension and no mass. There is no tenderness. There is no rebound and no guarding.  Genitourinary: Rectum normal, prostate normal and penis normal. Rectal exam shows guaiac negative stool. No penile tenderness.  Musculoskeletal: Normal range of motion. He exhibits no edema or tenderness.  Lymphadenopathy:    He has no cervical adenopathy.  Neurological: He is alert and oriented to person, place, and time. He has normal reflexes. No cranial nerve deficit. He exhibits normal muscle tone. Coordination normal.  Skin: Skin is warm and dry. No rash noted. He is not diaphoretic. No erythema. No pallor.  Psychiatric: He has a normal mood and affect. His behavior is normal. Judgment and thought content normal.  R knee w/trace swelling Loose body at the tip of the sternum R 5th MTP tender  Lab Results  Component Value Date   WBC 4.2 05/11/2016   HGB 13.9 05/11/2016   HCT 40.4 05/11/2016   PLT 208.0 05/11/2016   GLUCOSE 105 (H) 05/11/2016   CHOL 254 (H) 05/11/2016   TRIG 80.0 05/11/2016   HDL 97.40 05/11/2016   LDLDIRECT 131.6 03/15/2012   LDLCALC 140 (H) 05/11/2016   ALT 22 05/11/2016   AST 24 05/11/2016   NA 139 05/11/2016   K 5.6 (H) 05/11/2016   CL 102 05/11/2016   CREATININE 1.20 05/11/2016   BUN 27 (H) 05/11/2016   CO2 28 05/11/2016   TSH 5.46 (H) 05/11/2016   PSA 0.68 05/11/2016   HGBA1C 5.3 05/11/2016    No results found.  Assessment & Plan:   There are no diagnoses linked to this encounter. I am having Mr. Abid maintain his  aspirin, Vitamin D3, omeprazole, loratadine, losartan, buPROPion, Vitamin B-12, colchicine, indomethacin, levothyroxine, atorvastatin, benzonatate, traMADol, and allopurinol. We will continue to administer methylPREDNISolone acetate and methylPREDNISolone acetate.  No orders of the defined types were placed in this encounter.    Follow-up: No Follow-up on file.  Walker Kehr, MD

## 2016-08-03 NOTE — Assessment & Plan Note (Signed)
Losartan Uloric Colchicine

## 2016-08-03 NOTE — Assessment & Plan Note (Signed)
On B12 

## 2016-08-03 NOTE — Progress Notes (Signed)
Pre visit review using our clinic review tool, if applicable. No additional management support is needed unless otherwise documented below in the visit note. 

## 2016-08-03 NOTE — Assessment & Plan Note (Signed)
On Vit D 

## 2016-08-03 NOTE — Assessment & Plan Note (Signed)
Better Splint

## 2016-08-03 NOTE — Assessment & Plan Note (Signed)
Labs

## 2016-08-04 DIAGNOSIS — Z23 Encounter for immunization: Secondary | ICD-10-CM | POA: Diagnosis not present

## 2016-08-17 NOTE — Progress Notes (Signed)
[facility] [address], [place] [address] Phone: [phone] Subjective:    I'm seeing this patient by the request  of:  Walker Kehr, MD   CC: Right knee and right hand pain.  [id number]  George Fitzgerald is a 65 y.o. male coming in with complaint of Multiple complaints. Patient's past medical history significant for gout as well as vitamin D deficiency. Patient was found to have a finger dislocation fairly recently. Patient states that he does state that over the last 4 months. Still not making significant improvement. States that if he hits it wrong and still has significant amount pain. Denies any weakness. States though that it does hurt when he does a handshake. No numbness.  Right knee pain. Has had multiple knee effusions that has needed drainage before. Has been diagnosed with gout and is taking 300 mg of allopurinol. Uses colchicine for breakthrough. Patient states that unfortunately he continues to have his comfort more on the anterior aspect of the knee. 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 Patient is having an endurance race in June and neck Sheron needs to be pain free at that time.       Past Medical History:  Diagnosis Date  . Arthritis   . Ganglion cyst    L wrist, R foot  . GERD (gastroesophageal reflux disease)   . Gout   . Hyperlipidemia   . Hypothyroidism 05/06/2015  . 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 Varicose veins    L   Past Surgical History:  Procedure Laterality Date  . MASS EXCISION  07/28/2011   Procedure: EXCISION MASS;  Surgeon: Wylene Simmer, MD;  Location: [facility];  Service: Orthopedics;  Laterality: Left;  left foot ganglion cyst excision  . VARICOSE VEIN SURGERY  2011   Social History   Social History  . Marital status: Married    Spouse name: N/A  . Number of children: 1  . Years of education: N/A    Occupational History  . retired    Social History Main Topics  . Smoking status: Never Smoker  . Smokeless tobacco: Never Used  . Alcohol use 12.6 oz/week    14 Glasses of wine, 7 Standard drinks or equivalent per week  . Drug use: No  . Sexual activity: Yes   Other Topics Concern  . None   Social History Narrative   Regular exercise-yes,sailor   No Known Allergies Family History  Problem Relation Age of Onset  . Stroke Mother   . Nephritis Father   . Kidney disease Father 45    nephritis    Past medical history, social, surgical and family history all reviewed in electronic medical record.  No pertanent information unless stated regarding to the chief complaint.   Review of Systems:Review of systems updated and as accurate as of 08/18/16  No headache, visual changes, nausea, vomiting, diarrhea, constipation, dizziness, abdominal pain, skin rash, fevers, chills, night sweats, weight loss, swollen lymph nodes, body aches, joint swelling, muscle aches, chest pain, shortness of breath, mood changes.   Objective  Blood pressure 130/74, pulse 81, height 6\' 2"  (1.88 m), weight 176 lb (79.8 kg), SpO2 96 %. Systems examined below as of 08/18/16   General: No apparent distress alert and oriented x3 mood and affect normal, dressed appropriately.  HEENT: Pupils equal, extraocular movements intact  Respiratory: Patient's speak in full sentences and does not appear  short of breath  Cardiovascular: No lower extremity edema, non tender, no erythema  Skin: Warm dry intact with no signs of infection or rash on extremities or on axial skeleton.  Abdomen: Soft nontender  Neuro: Cranial nerves II through XII are intact, neurovascularly intact in all extremities with 2+ DTRs and 2+ pulses.  Lymph: No lymphadenopathy of posterior or anterior cervical chain or axillae bilaterally.  Gait normal with good balance and coordination.  MSK:  Non tender with full range of motion and good stability  and symmetric strength and tone of shoulders, elbows, hip and ankles bilaterally.  Right-hand exam shows the patient does have some mild chronic subluxation of the fifth metacarpal. Mild tenderness to palpation just proximal to the joint. No crepitus noted. No angulation noted. Neurovascular intact distally. 4+ out of 5 strength compared to the contralateral side  Knee:right Mild lateral tilt of the knee. Atrophy of the muscle noted Pain over the patellofemoral joint Lacks last 5 of extension Ligaments with solid consistent endpoints including ACL, PCL, LCL, MCL. Negative Mcmurray's, Apley's, and Thessalonian tests. painful patellar compression. Patellar glide with moderate crepitus. Patellar and quadriceps tendons unremarkable. Hamstring and quadriceps strength is normal.    MSK US performed of: Right hand This study was ordered, performed, and interpreted by Charlann Boxer D.O.  Wrist: All extensor compartments visualized and tendons all normal in appearance without fraying, tears, or sheath effusions. No effusion seen. TFCC intact. Scapholunate ligament intact. Carpal tunnel visualized and median nerve area normal, flexor tendons all normal in appearance without fraying, tears, or sheath effusions. Power doppler signal normal.  IMPRESSION:  Chronic fracture proximal MCP fifth finger   procedure note 97110; 15 minutes spent for Therapeutic exercises as stated in above notes.  This included exercises focusing on stretching, strengthening, with significant focus on eccentric aspects. Patellofemoral Syndrome  Reviewed anatomy using anatomical model and how PFS occurs.  Given rehab exercises handout for VMO, hip abductors, core, entire kinetic chain including proprioception exercises including cone touches, step downs, hip elevations and turn outs.  Could benefit from PT, regular exercise, upright biking, and a PFS knee brace to assist with tracking abnormalities.   Proper technique  shown and discussed handout in great detail with ATC.  All questions were discussed and answered.   Impression and Recommendations:     This case required medical decision making of moderate complexity.      Note: This dictation was prepared with Dragon dictation along with smaller phrase technology. Any transcriptional errors that result from this process are unintentional.

## 2016-08-18 ENCOUNTER — Encounter: Payer: Self-pay | Admitting: Family Medicine

## 2016-08-18 ENCOUNTER — Ambulatory Visit (INDEPENDENT_AMBULATORY_CARE_PROVIDER_SITE_OTHER): Payer: Medicare Other | Admitting: Family Medicine

## 2016-08-18 ENCOUNTER — Telehealth: Payer: Self-pay | Admitting: Internal Medicine

## 2016-08-18 DIAGNOSIS — E559 Vitamin D deficiency, unspecified: Secondary | ICD-10-CM | POA: Diagnosis not present

## 2016-08-18 DIAGNOSIS — E034 Atrophy of thyroid (acquired): Secondary | ICD-10-CM

## 2016-08-18 DIAGNOSIS — M1711 Unilateral primary osteoarthritis, right knee: Secondary | ICD-10-CM | POA: Insufficient documentation

## 2016-08-18 DIAGNOSIS — S62308A Unspecified fracture of other metacarpal bone, initial encounter for closed fracture: Secondary | ICD-10-CM | POA: Insufficient documentation

## 2016-08-18 DIAGNOSIS — S62306A Unspecified fracture of fifth metacarpal bone, right hand, initial encounter for closed fracture: Secondary | ICD-10-CM | POA: Diagnosis not present

## 2016-08-18 DIAGNOSIS — E785 Hyperlipidemia, unspecified: Secondary | ICD-10-CM

## 2016-08-18 MED ORDER — VITAMIN D (ERGOCALCIFEROL) 1.25 MG (50000 UNIT) PO CAPS: 50000.0000 [IU] | ORAL_CAPSULE | ORAL | 0 refills | Status: DC

## 2016-08-18 NOTE — Telephone Encounter (Signed)
Patient is asking that we re-enter labs he was asked to get at his most recent visit. He is going to come on Monday to get the labs

## 2016-08-18 NOTE — Assessment & Plan Note (Signed)
I believe the patient doesn't patellofemoral arthritis of the knee. We discussed with patient at great length. Patient will try home exercises, has tramadol for breaks or pain, once weekly vitamin D, we discussed compression and patient was given a brace today to help alleviate some the lateral translation of the knee. Patient will do more of an icing pedicle. Follow-up again in 6 weeks.

## 2016-08-18 NOTE — Patient Instructions (Signed)
Good to see you  Wear the brace on the finger at night  Ice is your friend  Once weekly vitamin D for the next 12 weeks. For the knee Wear brace with working out.  Alternate upper body and lower body 3 times a week.  Look into full body vibration machine.  Avoid deep squats.  Monitor the diet for the gout.  pennsaid pinkie amount topically 2 times daily as needed.   See me again in 6 weeks.

## 2016-08-18 NOTE — Assessment & Plan Note (Addendum)
Bracing, once weekly vitamin D likely will take some time. Known significant improvement consider x-rays as well as an ulnar gutter splint.

## 2016-08-18 NOTE — Assessment & Plan Note (Signed)
supplement

## 2016-08-18 NOTE — Telephone Encounter (Signed)
Lab orders were entered on 8/24 Thx

## 2016-08-19 ENCOUNTER — Encounter: Payer: Self-pay | Admitting: Family Medicine

## 2016-08-19 ENCOUNTER — Encounter: Payer: Self-pay | Admitting: Internal Medicine

## 2016-08-19 NOTE — Telephone Encounter (Signed)
Lab orders placed again. Left detailed mess informing pt.

## 2016-08-19 NOTE — Telephone Encounter (Signed)
My apologies. They are not showing active because it looks like an expected date was entered.

## 2016-09-29 ENCOUNTER — Ambulatory Visit (INDEPENDENT_AMBULATORY_CARE_PROVIDER_SITE_OTHER): Payer: Medicare Other | Admitting: Family Medicine

## 2016-09-29 ENCOUNTER — Encounter: Payer: Self-pay | Admitting: Family Medicine

## 2016-09-29 DIAGNOSIS — S62306D Unspecified fracture of fifth metacarpal bone, right hand, subsequent encounter for fracture with routine healing: Secondary | ICD-10-CM

## 2016-09-29 DIAGNOSIS — M1711 Unilateral primary osteoarthritis, right knee: Secondary | ICD-10-CM | POA: Diagnosis not present

## 2016-09-29 DIAGNOSIS — S338XXA Sprain of other parts of lumbar spine and pelvis, initial encounter: Secondary | ICD-10-CM | POA: Insufficient documentation

## 2016-09-29 MED ORDER — COLCHICINE 0.6 MG PO TABS: 0.6000 mg | ORAL_TABLET | Freq: Four times a day (QID) | ORAL | 5 refills | Status: DC | PRN

## 2016-09-29 MED ORDER — INDOMETHACIN 50 MG PO CAPS: 50.0000 mg | ORAL_CAPSULE | Freq: Three times a day (TID) | ORAL | 3 refills | Status: DC | PRN

## 2016-09-29 NOTE — Assessment & Plan Note (Signed)
Doing much better overall. We discussed icing regimen. Continuing to be active. Follow-up as needed

## 2016-09-29 NOTE — Assessment & Plan Note (Signed)
Healed at this time. No significant changes in management.

## 2016-09-29 NOTE — Progress Notes (Signed)
[facility] [place], [place] [address] Phone: [phone] Subjective:    I'm seeing this patient by the request  of:  Walker Kehr, MD   CC: Right knee and right hand pain f/u  New low back pain .  [id number]  Parth Kolar is a 66 y.o. male coming in with complaint of Multiple complaints. Patient's past medical history significant for gout as well as vitamin D deficiency.  Patient did have a figure or dislocation and didn't have unfortunate fracture. Patient did do bracing and once weekly vitamin D. States that it is completely resolved at this time. Not having any pain. Very happy with the results.  Right knee pain. Patient was found to have more patellofemoral arthritis. Was given an injection as well as a brace. Feeling significant a better at this time. No significant pain whatsoever. Patient states he is able to do all daily activities and has been able to do his boating.  Patient is having a new problem. Low back pain. Right-sided. Very localized. Worse with extension of the back. Started about 3 days ago. Denies any fevers chills or any abnormal weight loss. Denies any radiation down the leg or any numbness or weakness. Rates the severity of pain a 4 out of 10.       Past Medical History:  Diagnosis Date  . Arthritis   . Ganglion cyst    L wrist, R foot  . GERD (gastroesophageal reflux disease)   . Gout   . Hyperlipidemia   . Hypothyroidism 05/06/2015  . 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 Varicose veins    L   Past Surgical History:  Procedure Laterality Date  . MASS EXCISION  07/28/2011   Procedure: EXCISION MASS;  Surgeon: Wylene Simmer, MD;  Location: [facility];  Service: Orthopedics;  Laterality: Left;  left foot ganglion cyst excision  . VARICOSE VEIN SURGERY  2011   Social History   Social History  . Marital status: Married    Spouse name: N/A  . Number of children: 1  . Years of  education: N/A   Occupational History  . retired    Social History Main Topics  . Smoking status: Never Smoker  . Smokeless tobacco: Never Used  . Alcohol use 12.6 oz/week    14 Glasses of wine, 7 Standard drinks or equivalent per week  . Drug use: No  . Sexual activity: Yes   Other Topics Concern  . None   Social History Narrative   Regular exercise-yes,sailor   No Known Allergies Family History  Problem Relation Age of Onset  . Stroke Mother   . Nephritis Father   . Kidney disease Father 32    nephritis    Past medical history, social, surgical and family history all reviewed in electronic medical record.  No pertanent information unless stated regarding to the chief complaint.   Review of Systems: No headache, visual changes, nausea, vomiting, diarrhea, constipation, dizziness, abdominal pain, chest pain, shortness of breath, mood changes.    Objective  Blood pressure (!) 160/90, pulse 66, height 6\' 2"  (1.88 m), weight 176 lb 9.6 oz (80.1 kg). Systems examined below as of 09/29/16   Systems examined below as of 09/29/16 General: NAD A&O x3 mood, affect normal  HEENT: Pupils equal, extraocular movements intact no nystagmus Respiratory: not short of breath at rest or with speaking Cardiovascular: No lower extremity edema, non tender Skin: Warm dry intact with no signs of  infection or rash on extremities or on axial skeleton. Abdomen: Soft nontender, no masses Neuro: Cranial nerves  intact, neurovascularly intact in all extremities with 2+ DTRs and 2+ pulses. Lymph: No lymphadenopathy appreciated today  Gait normal with good balance and coordination.  MSK: Non tender with full range of motion and good stability and symmetric strength and tone of shoulders, elbows, wrist,  hips and ankles bilaterally.   Right-hand exam  Is unremarkable except for callus formation over the fifth metacarpal. Nontender though. Full range of motion and good grip  Knee: Right Normal to  inspection with no erythema or effusion or obvious bony abnormalities. Mild tenderness to palpation over the medial joint line but very minimal. ROM full in flexion and extension and lower leg rotation. Ligaments with solid consistent endpoints including ACL, PCL, LCL, MCL. Negative Mcmurray's, Apley's, and Thessalonian tests. Non painful patellar compression. Patellar glide without crepitus. Patellar and quadriceps tendons unremarkable. Hamstring and quadriceps strength is normal.   Contralateral knee unremarkable  Back Exam:  Inspection: Unremarkable  Motion: Flexion 45 deg, Extension 10 deg with pain localizing the right iliolumbar region, Side Bending to 45 deg bilaterally,  Rotation to 45 deg bilaterally  SLR laying: Negative  XSLR laying: Negative  Palpable tenderness: Pain over the iliolumbar region. FABER: negative. Sensory change: Gross sensation intact to all lumbar and sacral dermatomes.  Reflexes: 2+ at both patellar tendons, 2+ at achilles tendons, Babinski's downgoing.  Strength at foot  Plantar-flexion: 5/5 Dorsi-flexion: 5/5 Eversion: 5/5 Inversion: 5/5  Leg strength  Quad: 5/5 Hamstring: 5/5 Hip flexor: 5/5 Hip abductors: 5/5  Gait unremarkable.   Impression and Recommendations:     This case required medical decision making of moderate complexity.      Note: This dictation was prepared with Dragon dictation along with smaller phrase technology. Any transcriptional errors that result from this process are unintentional.

## 2016-09-29 NOTE — Patient Instructions (Addendum)
Good to see you  You are doing great at this time.  FOr the back try these exercises  On the bench lift your feet off the ground so you do not arch your back.  Ice 20 minutes 2 times daily. Usually after activity and before bed. Go to 2000 IU daily of vitamin D  See me again in 4 weeks if not better otherwise see me when you need me.

## 2016-09-29 NOTE — Assessment & Plan Note (Signed)
Home exercises given today. Discussed icing, topical anti-inflammatories and which activities to avoid for short course. Follow-up again in 4 weeks if pain is not completely resolved.  97110; 15 minutes spent for Therapeutic exercises as stated in above notes.  This included exercises focusing on stretching, strengthening, with significant focus on eccentric aspects.  Low back exercises that included:  Pelvic tilt/bracing instruction to focus on control of the pelvic girdle and lower abdominal muscles  Glute strengthening exercises, focusing on proper firing of the glutes without engaging the low back muscles Proper stretching techniques for maximum relief for the hamstrings, hip flexors, low back and some rotation where tolerated   Proper technique shown and discussed handout in great detail with ATC.  All questions were discussed and answered.

## 2016-09-30 ENCOUNTER — Encounter: Payer: Self-pay | Admitting: Internal Medicine

## 2016-09-30 ENCOUNTER — Encounter: Payer: Self-pay | Admitting: Family Medicine

## 2016-10-03 ENCOUNTER — Other Ambulatory Visit: Payer: Self-pay | Admitting: *Deleted

## 2016-10-03 MED ORDER — TRAMADOL HCL 50 MG PO TABS: ORAL_TABLET | ORAL | 1 refills | Status: DC

## 2016-10-03 NOTE — Telephone Encounter (Signed)
  Rf phoned in. See meds.   Erline Levine,   Please call in  Thx    ----- Message -----  From: Riley Kill, CMA  Sent: 09/30/2016  1:18 PM  To: Cassandria Anger, MD  Subject: [name]: Visit Follow-Up Question                 ----- Message -----  From: [name]  Sent: 09/30/2016 12:24 PM  To: [facility]  Subject: Visit Follow-Up Question               Dr. Alain Marion,  Is it possible to get a refill on my Tramadol 50 MG, QTY 60.  I have been using more recently to nurse a workout injury, plus I am planning to travel for a few weeks and don't want to go without any. I have a few pills left, but not enough for 2 weeks. Pharmacy is the same, [facility].  On another note, thank you so much for referring me to to Dr. Tamala Julian. He has been terrific at healing my right knee and broken finger situation.  Best,  [facility]

## 2016-10-22 ENCOUNTER — Encounter: Payer: Self-pay | Admitting: Family Medicine

## 2016-10-27 ENCOUNTER — Ambulatory Visit: Payer: Medicare Other | Admitting: Family Medicine

## 2017-01-09 ENCOUNTER — Encounter: Payer: Self-pay | Admitting: Internal Medicine

## 2017-01-12 ENCOUNTER — Ambulatory Visit: Payer: Medicare Other | Admitting: Internal Medicine

## 2017-05-31 ENCOUNTER — Encounter: Payer: Self-pay | Admitting: Internal Medicine

## 2017-06-01 ENCOUNTER — Other Ambulatory Visit: Payer: Self-pay | Admitting: Internal Medicine

## 2017-06-01 MED ORDER — ALLOPURINOL 300 MG PO TABS: 300.0000 mg | ORAL_TABLET | Freq: Every day | ORAL | 3 refills | Status: DC

## 2017-06-01 MED ORDER — COLCHICINE 0.6 MG PO TABS: 0.6000 mg | ORAL_TABLET | Freq: Four times a day (QID) | ORAL | 5 refills | Status: DC | PRN

## 2017-06-01 MED ORDER — ATORVASTATIN CALCIUM 40 MG PO TABS: 40.0000 mg | ORAL_TABLET | Freq: Every day | ORAL | 3 refills | Status: DC

## 2017-06-01 MED ORDER — TRAMADOL HCL 50 MG PO TABS: ORAL_TABLET | ORAL | 1 refills | Status: DC

## 2017-07-26 ENCOUNTER — Telehealth: Payer: Self-pay | Admitting: Internal Medicine

## 2017-07-26 NOTE — Telephone Encounter (Signed)
Called patient to schedule AWV. Lvm for patient to call office to schedule appt. Last AWV 05/05/2016. Patient can schedule appt at anytime. SF

## 2017-08-05 DIAGNOSIS — B9689 Other specified bacterial agents as the cause of diseases classified elsewhere: Secondary | ICD-10-CM | POA: Diagnosis not present

## 2017-08-05 DIAGNOSIS — J22 Unspecified acute lower respiratory infection: Secondary | ICD-10-CM | POA: Diagnosis not present

## 2017-08-05 DIAGNOSIS — J019 Acute sinusitis, unspecified: Secondary | ICD-10-CM | POA: Diagnosis not present

## 2017-08-20 ENCOUNTER — Encounter: Payer: Self-pay | Admitting: Internal Medicine

## 2018-06-01 NOTE — Progress Notes (Signed)
[name] [place], [place] [address] Phone: [phone] Subjective:    I [name] am serving as a Education administrator for Dr. Hulan Saas.   I'm seeing this patient by the request  of:  Plotnikov, Evie Lacks, MD   CC: Low back pain  [id number]  [facility] is a 67 y.o. male coming in with complaint of back pain. States that he was working out when he injured his back. Training for competitive sailing. His personal trainer has him doing some different exercises that caused pain. Lateral abdominal exercises. No numbness and tingling noted. Has a big race coming up and wants to get back into the gym. Hasn't been exercising for about a month and a half. History of gout. Would like tramadol refill.   Onset- Last week of July  Location- right sided back pain, lumbar  Character- Sharp Aggravating factors- Side bending Reliving factors- Heating pad Therapies tried-heating pad only and decreasing exercises Severity-6 out of 10 in severity     Past Medical History:  Diagnosis Date  . Arthritis   . Ganglion cyst    L wrist, R foot  . GERD (gastroesophageal reflux disease)   . Gout   . Hyperlipidemia   . Hypothyroidism 05/06/2015  . 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 Varicose veins    L   Past Surgical History:  Procedure Laterality Date  . MASS EXCISION  07/28/2011   Procedure: EXCISION MASS;  Surgeon: Wylene Simmer, MD;  Location: [facility];  Service: Orthopedics;  Laterality: Left;  left foot ganglion cyst excision  . VARICOSE VEIN SURGERY  2011   Social History   Socioeconomic History  . Marital status: Married    Spouse name: Not on file  . Number of children: 1  . Years of education: Not on file  . Highest education level: Not on file  Occupational History  . Occupation: retired  Scientific laboratory technician  . Financial resource strain: Not on file  . Food insecurity:    Worry: Not on file    Inability: Not on  file  . Transportation needs:    Medical: Not on file    Non-medical: Not on file  Tobacco Use  . Smoking status: Never Smoker  . Smokeless tobacco: Never Used  Substance and Sexual Activity  . Alcohol use: Yes    Alcohol/week: 21.0 standard drinks    Types: 14 Glasses of wine, 7 Standard drinks or equivalent per week  . Drug use: No  . Sexual activity: Yes  Lifestyle  . Physical activity:    Days per week: Not on file    Minutes per session: Not on file  . Stress: Not on file  Relationships  . Social connections:    Talks on phone: Not on file    Gets together: Not on file    Attends religious service: Not on file    Active member of club or organization: Not on file    Attends meetings of clubs or organizations: Not on file    Relationship status: Not on file  Other Topics Concern  . Not on file  Social History Narrative   Regular exercise-yes,sailor   No Known Allergies Family History  Problem Relation Age of Onset  . Stroke Mother   . Nephritis Father   . Kidney disease Father 39       nephritis    Current Outpatient Medications (Endocrine & Metabolic):  .  levothyroxine (SYNTHROID, LEVOTHROID)  50 MCG tablet, Take 1 tablet (50 mcg total) by mouth daily.  Current Facility-Administered Medications (Endocrine & Metabolic):  .  methylPREDNISolone acetate (DEPO-MEDROL) injection 20 mg .  methylPREDNISolone acetate (DEPO-MEDROL) injection 40 mg  Current Outpatient Medications (Cardiovascular):  .  atorvastatin (LIPITOR) 40 MG tablet, Take 1 tablet (40 mg total) by mouth daily.     Current Outpatient Medications (Analgesics):  .  allopurinol (ZYLOPRIM) 300 MG tablet, Take 1 tablet (300 mg total) by mouth daily. Marland Kitchen  aspirin 81 MG tablet, Take 81 mg by mouth daily.   .  colchicine 0.6 MG tablet, Take 1 tablet (0.6 mg total) by mouth 4 (four) times daily as needed. .  indomethacin (INDOCIN) 50 MG capsule, Take 1 capsule (50 mg total) by mouth 3 (three) times daily as  needed. .  traMADol (ULTRAM) 50 MG tablet, take 1-2 tablets by mouth twice a day if needed for pain maximum daily dose of 8   Current Outpatient Medications (Hematological):  Marland Kitchen  Cyanocobalamin (VITAMIN B-12) 1000 MCG SUBL, Place 1 tablet (1,000 mcg total) under the tongue daily.   Current Outpatient Medications (Other):  Marland Kitchen  buPROPion (WELLBUTRIN SR) 100 MG 12 hr tablet, Take 1 tablet (100 mg total) by mouth daily. qam .  Cholecalciferol (VITAMIN D3) 2000 UNITS capsule, Take 3,000 Units by mouth daily.  Marland Kitchen  omeprazole (PRILOSEC) 20 MG capsule, Take 20 mg by mouth daily. .  Vitamin D, Ergocalciferol, (DRISDOL) 50000 units CAPS capsule, Take 1 capsule (50,000 Units total) by mouth every 7 (seven) days.     Past medical history, social, surgical and family history all reviewed in electronic medical record.  No pertanent information unless stated regarding to the chief complaint.   Review of Systems:  No headache, visual changes, nausea, vomiting, diarrhea, constipation, dizziness, abdominal pain, skin rash, fevers, chills, night sweats, weight loss, swollen lymph nodes, body aches, joint swelling,  chest pain, shortness of breath, mood changes.  Positive muscle aches  Objective  Blood pressure 110/80, pulse 82, height 6\' 2"  (1.88 m), weight 188 lb (85.3 kg), SpO2 98 %.   General: No apparent distress alert and oriented x3 mood and affect normal, dressed appropriately.  HEENT: Pupils equal, extraocular movements intact  Respiratory: Patient's speak in full sentences and does not appear short of breath  Cardiovascular: No lower extremity edema, non tender, no erythema  Skin: Warm dry intact with no signs of infection or rash on extremities or on axial skeleton.  Abdomen: Soft nontender  Neuro: Cranial nerves II through XII are intact, neurovascularly intact in all extremities with 2+ DTRs and 2+ pulses.  Lymph: No lymphadenopathy of posterior or anterior cervical chain or axillae  bilaterally.  Gait normal with good balance and coordination.  MSK:  Non tender with full range of motion and good stability and symmetric strength and tone of shoulders, elbows, wrist, hip, knee and ankles bilaterally.  Back Exam:  Inspection: Unremarkable  Motion: Flexion 45 deg, Extension 15 deg, Side Bending to 35 deg bilaterally,  Rotation to 35 deg bilaterally  SLR laying: Negative  XSLR laying: Negative  Palpable tenderness: Tender to palpation the paraspinal musculature lumbar spine right greater than left. FABER: negative. Sensory change: Gross sensation intact to all lumbar and sacral dermatomes.  Reflexes: 2+ at both patellar tendons, 2+ at achilles tendons, Babinski's downgoing.  Strength at foot  Plantar-flexion: 5/5 Dorsi-flexion: 5/5 Eversion: 5/5 Inversion: 5/5  Leg strength  Quad: 5/5 Hamstring: 5/5 Hip flexor: 5/5 Hip abductors: 5/5  Gait unremarkable.   97110; 15 additional minutes spent for Therapeutic exercises as stated in above notes.  This included exercises focusing on stretching, strengthening, with significant focus on eccentric aspects.   Long term goals include an improvement in range of motion, strength, endurance as well as avoiding reinjury. Patient's frequency would include in 1-2 times a day, 3-5 times a week for a duration of 6-12 weeks.  Low back exercises that included:  Pelvic tilt/bracing instruction to focus on control of the pelvic girdle and lower abdominal muscles  Glute strengthening exercises, focusing on proper firing of the glutes without engaging the low back muscles Proper stretching techniques for maximum relief for the hamstrings, hip flexors, low back and some rotation where tolerated Proper technique shown and discussed handout in great detail with ATC.  All questions were discussed and answered.     Impression and Recommendations:     This case required medical decision making of moderate complexity. The above documentation has been  reviewed and is accurate and complete Lyndal Pulley, DO       Note: This dictation was prepared with Dragon dictation along with smaller phrase technology. Any transcriptional errors that result from this process are unintentional.

## 2018-06-04 ENCOUNTER — Encounter: Payer: Self-pay | Admitting: Family Medicine

## 2018-06-04 ENCOUNTER — Encounter

## 2018-06-04 ENCOUNTER — Ambulatory Visit (INDEPENDENT_AMBULATORY_CARE_PROVIDER_SITE_OTHER)
Admission: RE | Admit: 2018-06-04 | Discharge: 2018-06-04 | Disposition: A | Discharge status: Home / Self Care | Payer: Medicare Other | Source: Ambulatory Visit | Attending: Family Medicine | Admitting: Family Medicine

## 2018-06-04 ENCOUNTER — Ambulatory Visit (INDEPENDENT_AMBULATORY_CARE_PROVIDER_SITE_OTHER): Payer: Medicare Other | Admitting: Family Medicine

## 2018-06-04 VITALS — BP 110/80 | HR 82 | Ht 74.0 in | Wt 188.0 lb

## 2018-06-04 DIAGNOSIS — G8929 Other chronic pain: Secondary | ICD-10-CM | POA: Diagnosis not present

## 2018-06-04 DIAGNOSIS — M545 Low back pain: Secondary | ICD-10-CM | POA: Diagnosis not present

## 2018-06-04 DIAGNOSIS — S338XXA Sprain of other parts of lumbar spine and pelvis, initial encounter: Secondary | ICD-10-CM | POA: Diagnosis not present

## 2018-06-04 MED ORDER — VITAMIN D (ERGOCALCIFEROL) 1.25 MG (50000 UNIT) PO CAPS: 50000.0000 [IU] | ORAL_CAPSULE | ORAL | 0 refills | Status: DC

## 2018-06-04 NOTE — Patient Instructions (Addendum)
Good to see you  Ice is your friend Ice 20 minutes 2 times daily. Usually after activity and before bed. Heat before activity  Machine weights are good Xray downstairs Once weekly vitamin D for 12 weeks stop daily  See me again in 4 weeks

## 2018-06-04 NOTE — Assessment & Plan Note (Signed)
I believe the patient does have a sprain and likely some underlying degenerative disc disease.  X-rays ordered today.  Any worsening symptoms may need to consider treatment for radicular symptoms.  We will discuss formal physical therapy as well.  Discussed icing regimen.  Discussed if any worsening weakness of any of the extremities and we need to consider advanced imaging.  Patient will follow-up with me again in 4 to 8 weeks

## 2018-06-06 ENCOUNTER — Encounter: Payer: Self-pay | Admitting: Family Medicine

## 2018-08-28 ENCOUNTER — Other Ambulatory Visit (INDEPENDENT_AMBULATORY_CARE_PROVIDER_SITE_OTHER): Payer: Medicare Other

## 2018-08-28 ENCOUNTER — Encounter: Payer: Self-pay | Admitting: Family

## 2018-08-28 ENCOUNTER — Ambulatory Visit (INDEPENDENT_AMBULATORY_CARE_PROVIDER_SITE_OTHER): Payer: Medicare Other | Admitting: Family

## 2018-08-28 VITALS — BP 146/82 | HR 72 | Temp 98.2°F | Ht 74.0 in | Wt 198.0 lb

## 2018-08-28 DIAGNOSIS — E782 Mixed hyperlipidemia: Secondary | ICD-10-CM | POA: Diagnosis not present

## 2018-08-28 DIAGNOSIS — R7309 Other abnormal glucose: Secondary | ICD-10-CM

## 2018-08-28 DIAGNOSIS — Z125 Encounter for screening for malignant neoplasm of prostate: Secondary | ICD-10-CM

## 2018-08-28 DIAGNOSIS — R3915 Urgency of urination: Secondary | ICD-10-CM

## 2018-08-28 DIAGNOSIS — R200 Anesthesia of skin: Secondary | ICD-10-CM

## 2018-08-28 DIAGNOSIS — E538 Deficiency of other specified B group vitamins: Secondary | ICD-10-CM | POA: Diagnosis not present

## 2018-08-28 DIAGNOSIS — R202 Paresthesia of skin: Secondary | ICD-10-CM

## 2018-08-28 DIAGNOSIS — R55 Syncope and collapse: Secondary | ICD-10-CM | POA: Diagnosis not present

## 2018-08-28 DIAGNOSIS — R42 Dizziness and giddiness: Secondary | ICD-10-CM

## 2018-08-28 LAB — LIPID PANEL
Cholesterol: 285 mg/dL — ABNORMAL HIGH (ref 0–200)
HDL: 88.5 mg/dL (ref >39.00)
LDL Cholesterol: 173 mg/dL — ABNORMAL HIGH (ref 0–99)
NONHDL: 196.65
TRIGLYCERIDES: 116 mg/dL (ref 0.0–149.0)
Total CHOL/HDL Ratio: 3
VLDL: 23.2 mg/dL (ref 0.0–40.0)

## 2018-08-28 LAB — COMPREHENSIVE METABOLIC PANEL
ALT: 24 U/L (ref 0–53)
AST: 24 U/L (ref 0–37)
Albumin: 4.9 g/dL (ref 3.5–5.2)
Alkaline Phosphatase: 66 U/L (ref 39–117)
BUN: 17 mg/dL (ref 6–23)
CO2: 29 mEq/L (ref 19–32)
Calcium: 9.6 mg/dL (ref 8.4–10.5)
Chloride: 103 mEq/L (ref 96–112)
Creatinine, Ser: 1.11 mg/dL (ref 0.40–1.50)
GFR: 69.71 mL/min (ref >60.00)
Glucose, Bld: 106 mg/dL — ABNORMAL HIGH (ref 70–99)
Potassium: 5.1 mEq/L (ref 3.5–5.1)
Sodium: 140 mEq/L (ref 135–145)
Total Bilirubin: 0.6 mg/dL (ref 0.2–1.2)
Total Protein: 7.5 g/dL (ref 6.0–8.3)

## 2018-08-28 LAB — CBC WITH DIFFERENTIAL/PLATELET
Basophils Absolute: 0.1 10*3/uL (ref 0.0–0.1)
Basophils Relative: 1.4 % (ref 0.0–3.0)
Eosinophils Absolute: 0.1 10*3/uL (ref 0.0–0.7)
Eosinophils Relative: 2 % (ref 0.0–5.0)
HCT: 43.1 % (ref 39.0–52.0)
HEMOGLOBIN: 14.8 g/dL (ref 13.0–17.0)
Lymphocytes Relative: 18.2 % (ref 12.0–46.0)
Lymphs Abs: 1.1 10*3/uL (ref 0.7–4.0)
MCHC: 34.4 g/dL (ref 30.0–36.0)
MCV: 101.4 fl — ABNORMAL HIGH (ref 78.0–100.0)
Monocytes Absolute: 0.5 10*3/uL (ref 0.1–1.0)
Monocytes Relative: 8.6 % (ref 3.0–12.0)
Neutro Abs: 4.1 10*3/uL (ref 1.4–7.7)
Neutrophils Relative %: 69.8 % (ref 43.0–77.0)
Platelets: 204 10*3/uL (ref 150.0–400.0)
RBC: 4.25 Mil/uL (ref 4.22–5.81)
RDW: 13.1 % (ref 11.5–15.5)
WBC: 5.9 10*3/uL (ref 4.0–10.5)

## 2018-08-28 LAB — VITAMIN B12: Vitamin B-12: 359 pg/mL (ref 211–911)

## 2018-08-28 LAB — HEMOGLOBIN A1C: Hgb A1c MFr Bld: 5.6 % (ref 4.6–6.5)

## 2018-08-28 LAB — PSA: PSA: 0.93 ng/mL (ref 0.10–4.00)

## 2018-08-28 LAB — TSH: TSH: 3.91 u[IU]/mL (ref 0.35–4.50)

## 2018-08-28 NOTE — Progress Notes (Signed)
George [facility] is a 67 y.o. male with the following history as recorded in [employer]:  Patient Active Problem List   Diagnosis Date Noted  . Sprain of iliolumbar ligament 09/29/2016  . Patellofemoral arthritis of right knee 08/18/2016  . Closed fracture of 5th metacarpal 08/18/2016  . Finger dislocation 05/05/2016  . Dyslipidemia 05/20/2015  . Vitamin D deficiency 05/06/2015  . B12 deficiency 05/06/2015  . Hypothyroidism 05/06/2015  . 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 Hematochezia 09/21/2012  . Hemorrhoids 09/21/2012  . Hyperkalemia 03/19/2012  . Elevated blood pressure 03/19/2012  . Grief reaction 03/19/2012  . Elevated glucose 09/07/2011  . Wrist pain, acute 07/28/2011  . Knee pain, right 06/02/2011  . Well adult exam 02/23/2011  . Postnasal drip 02/23/2011  . VARICOSE VEINS LOWER EXTREMITIES W/INFLAMMATION 05/01/2009  . GANGLION 05/01/2009  . EDEMA 05/01/2009  . Gout 12/17/2008  . Pain in soft tissues of limb 12/17/2008  . SKIN RASH 12/17/2008  . CAROTID BRUIT, LEFT 12/17/2008    Current Outpatient Medications  Medication Sig Dispense Refill  . Cholecalciferol (VITAMIN D3) 2000 UNITS capsule Take 3,000 Units by mouth daily.     . Cyanocobalamin (VITAMIN B-12) 1000 MCG SUBL Place 1 tablet (1,000 mcg total) under the tongue daily. 100 tablet 3  . indomethacin (INDOCIN) 50 MG capsule Take 1 capsule (50 mg total) by mouth 3 (three) times daily as needed. 60 capsule 3  . omeprazole (PRILOSEC) 20 MG capsule Take 20 mg by mouth daily as needed.     . traMADol (ULTRAM) 50 MG tablet take 1-2 tablets by mouth twice a day if needed for pain maximum daily dose of 8 60 tablet 1  . Vitamin D, Ergocalciferol, (DRISDOL) 50000 units CAPS capsule Take 1 capsule (50,000 Units total) by mouth every 7 (seven) days. 12 capsule 0  . allopurinol (ZYLOPRIM) 300 MG tablet Take 1 tablet (300 mg total) by mouth daily. (Patient not taking: Reported on 08/28/2018) 90  tablet 3  . aspirin 81 MG tablet Take 81 mg by mouth daily.      Marland Kitchen atorvastatin (LIPITOR) 40 MG tablet Take 1 tablet (40 mg total) by mouth daily. (Patient not taking: Reported on 08/28/2018) 90 tablet 3  . colchicine 0.6 MG tablet Take 1 tablet (0.6 mg total) by mouth 4 (four) times daily as needed. 30 tablet 5   Current Facility-Administered Medications  Medication Dose Route Frequency Provider Last Rate Last Dose  . methylPREDNISolone acetate (DEPO-MEDROL) injection 20 mg  20 mg Intra-Lesional Once Plotnikov, Aleksei V, MD      . methylPREDNISolone acetate (DEPO-MEDROL) injection 40 mg  40 mg Intra-articular Once Plotnikov, Evie Lacks, MD        Allergies: Patient has no known allergies.  Past Medical History:  Diagnosis Date  . Arthritis   . Ganglion cyst    L wrist, R foot  . GERD (gastroesophageal reflux disease)   . Gout   . Hyperlipidemia   . Hypothyroidism 05/06/2015  . 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 Varicose veins    L    Past Surgical History:  Procedure Laterality Date  . MASS EXCISION  07/28/2011   Procedure: EXCISION MASS;  Surgeon: Wylene Simmer, MD;  Location: [facility];  Service: Orthopedics;  Laterality: Left;  left foot ganglion cyst excision  . VARICOSE VEIN SURGERY  2011    Family History  Problem Relation Age of Onset  . Stroke Mother   . Nephritis Father   .  Kidney disease Father 22       nephritis    Social History   Tobacco Use  . Smoking status: Never Smoker  . Smokeless tobacco: Never Used  Substance Use Topics  . Alcohol use: Yes    Alcohol/week: 21.0 standard drinks    Types: 14 Glasses of wine, 7 Standard drinks or equivalent per week    Subjective:  Cold symptoms in early November- was treating with Robitussin and Mucinex; traveled over Thanksgiving- was started on Z-pak and felt like was helping with symptoms; has continued on Mucinex.   On Sunday, stood up from sitting from extended period of sitting- got  dizzy, had to get to counter for support; notes that he passed out for a few seconds- woke up on the floor; Stopped Robitussin 2 days prior to this episode.  Notes that he had no problems with dizziness prior to using the Robitussin and Mucinex for the cold symptoms. Denies any chest pain, shortness of breath, palpitations; Does have history of hypertension- not currently taking any medications. Does not check his pressure regularly; weight is noted to be up 10 pounds from OV in September 2019. ? History of hypothyroidsim- Synthroid is on medication list from 2016 but has not taken;     Objective:  Vitals:   08/28/18 1107  BP: (!) 146/82  Pulse: 72  Temp: 98.2 F (36.8 C)  TempSrc: Oral  SpO2: 97%  Weight: 198 lb (89.8 kg)  Height: '6\' 2"'$  (1.88 m)    General: Well developed, well nourished, in no acute distress  Skin : Warm and dry.  Head: Normocephalic and atraumatic  Eyes: Sclera and conjunctiva clear; pupils round and reactive to light; extraocular movements intact  Ears: External normal; canals clear; tympanic membranes normal  Oropharynx: Pink, supple. No suspicious lesions  Neck: Supple without thyromegaly, adenopathy; no carotid bruits noted Lungs: Respirations unlabored; clear to auscultation bilaterally without wheeze, rales, rhonchi  CVS exam: normal rate and regular rhythm.  Neurologic: Alert and oriented; speech intact; face symmetrical; moves all extremities well; CNII-XII intact without focal deficit   Assessment:  1. Dizziness   2. Syncope, unspecified syncope type   3. Mixed hyperlipidemia   4. B12 deficiency   5. Prostate cancer screening   6. Urinary urgency   7. Numbness and tingling   8. Elevated glucose     Plan:  1. & 2. EKG in office is reassuring- no acute ischemic changes; based on presentation of symptoms, am suspicious for dehydration with continued use of Robitussin and Mucinex as source of symptoms; he will stop those medications- use Flonase as  needed for drainage; will update CBC, CMP, TSH today; 4. Check B12 level; 5. & 6. Update PSA;  8. Check Hgba1c today;  Patient is encouraged to start checking his blood pressure regularly/ discussed with losing 10 pounds will help his blood pressure; follow-up if his blood pressure is consistently above 140/90- may need medication.   No follow-ups on file.  Orders Placed This Encounter  Procedures  . CBC w/Diff    Standing Status:   Future    Number of Occurrences:   1    Standing Expiration Date:   08/28/2019  . Comp Met (CMET)    Standing Status:   Future    Number of Occurrences:   1    Standing Expiration Date:   08/28/2019  . Lipid panel    Standing Status:   Future    Number of Occurrences:   1  Standing Expiration Date:   08/29/2019  . TSH    Standing Status:   Future    Number of Occurrences:   1    Standing Expiration Date:   08/28/2019  . HgB A1c    Standing Status:   Future    Number of Occurrences:   1    Standing Expiration Date:   08/28/2019  . B12    Standing Status:   Future    Number of Occurrences:   1    Standing Expiration Date:   08/28/2019  . PSA    Standing Status:   Future    Number of Occurrences:   1    Standing Expiration Date:   08/28/2019  . EKG 12-Lead    Requested Prescriptions    No prescriptions requested or ordered in this encounter

## 2018-08-30 ENCOUNTER — Encounter: Payer: Self-pay | Admitting: Family

## 2018-09-03 ENCOUNTER — Encounter: Payer: Self-pay | Admitting: Family

## 2018-12-07 ENCOUNTER — Encounter: Payer: Self-pay | Admitting: Gastroenterology

## 2018-12-11 ENCOUNTER — Encounter: Payer: Self-pay | Admitting: Family Medicine

## 2018-12-12 MED ORDER — LEVOTHYROXINE SODIUM 25 MCG PO TABS: 25.0000 ug | ORAL_TABLET | Freq: Every day | ORAL | 1 refills | Status: DC

## 2019-02-05 ENCOUNTER — Encounter: Payer: Self-pay | Admitting: Family Medicine

## 2019-02-12 NOTE — Progress Notes (Signed)
[facility] [place], [name] [address] Phone: [phone] Subjective:   Fontaine No, am serving as a scribe for Dr. Hulan Saas.  CC: Left thumb pain  IHW:[id number]     Update 02/13/2019: George Fitzgerald is a 68 y.o. male coming in with complaint of left thumb pain over the extensor tendons. 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 Pain continues when he is at rest but does eventually go away. Is using indomyocin for gout even though the rx has expired as he is using this for his pain in the past.  Patient rates the severity of pain is 7 out of 10 sometimes.      Past Medical History:  Diagnosis Date  . Arthritis   . Ganglion cyst    L wrist, R foot  . GERD (gastroesophageal reflux disease)   . Gout   . Hyperlipidemia   . Hypothyroidism 05/06/2015  . 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 Varicose veins    L   Past Surgical History:  Procedure Laterality Date  . MASS EXCISION  07/28/2011   Procedure: EXCISION MASS;  Surgeon: Wylene Simmer, MD;  Location: [facility];  Service: Orthopedics;  Laterality: Left;  left foot ganglion cyst excision  . VARICOSE VEIN SURGERY  2011   Social History   Socioeconomic History  . Marital status: Married    Spouse name: Not on file  . Number of children: 1  . Years of education: Not on file  . Highest education level: Not on file  Occupational History  . Occupation: retired  Scientific laboratory technician  . Financial resource strain: Not on file  . Food insecurity:    Worry: Not on file    Inability: Not on file  . Transportation needs:    Medical: Not on file    Non-medical: Not on file  Tobacco Use  . Smoking status: Never Smoker  . Smokeless tobacco: Never Used  Substance and Sexual Activity  . Alcohol use: Yes    Alcohol/week: 21.0 standard drinks    Types: 14 Glasses of wine, 7 Standard drinks or equivalent per week  .  Drug use: No  . Sexual activity: Yes  Lifestyle  . Physical activity:    Days per week: Not on file    Minutes per session: Not on file  . Stress: Not on file  Relationships  . Social connections:    Talks on phone: Not on file    Gets together: Not on file    Attends religious service: Not on file    Active member of club or organization: Not on file    Attends meetings of clubs or organizations: Not on file    Relationship status: Not on file  Other Topics Concern  . Not on file  Social History Narrative   Regular exercise-yes,sailor   No Known Allergies Family History  Problem Relation Age of Onset  . Stroke Mother   . Nephritis Father   . Kidney disease Father 24       nephritis    Current Outpatient Medications (Endocrine & Metabolic):  .  levothyroxine (SYNTHROID) 25 MCG tablet, Take 1 tablet (25 mcg total) by mouth daily before breakfast.  Current Facility-Administered Medications (Endocrine & Metabolic):  .  methylPREDNISolone acetate (DEPO-MEDROL) injection 20 mg .  methylPREDNISolone acetate (DEPO-MEDROL) injection 40 mg  Current Outpatient Medications (Cardiovascular):  .  atorvastatin (LIPITOR) 40  MG tablet, Take 1 tablet (40 mg total) by mouth daily.     Current Outpatient Medications (Analgesics):  .  allopurinol (ZYLOPRIM) 300 MG tablet, Take 1 tablet (300 mg total) by mouth daily. Marland Kitchen  aspirin 81 MG tablet, Take 81 mg by mouth daily.   .  indomethacin (INDOCIN) 50 MG capsule, Take 1 capsule (50 mg total) by mouth 3 (three) times daily as needed. .  traMADol (ULTRAM) 50 MG tablet, take 1-2 tablets by mouth twice a day if needed for pain maximum daily dose of 8 .  colchicine 0.6 MG tablet, Take 1 tablet (0.6 mg total) by mouth 4 (four) times daily as needed.   Current Outpatient Medications (Hematological):  Marland Kitchen  Cyanocobalamin (VITAMIN B-12) 1000 MCG SUBL, Place 1 tablet (1,000 mcg total) under the tongue daily.   Current Outpatient Medications  (Other):  Marland Kitchen  Cholecalciferol (VITAMIN D3) 2000 UNITS capsule, Take 3,000 Units by mouth daily.  Marland Kitchen  omeprazole (PRILOSEC) 20 MG capsule, Take 20 mg by mouth daily as needed.  .  Vitamin D, Ergocalciferol, (DRISDOL) 50000 units CAPS capsule, Take 1 capsule (50,000 Units total) by mouth every 7 (seven) days.     Past medical history, social, surgical and family history all reviewed in electronic medical record.  No pertanent information unless stated regarding to the chief complaint.   Review of Systems:  No headache, visual changes, nausea, vomiting, diarrhea, constipation, dizziness, abdominal pain, skin rash, fevers, chills, night sweats, weight loss, swollen lymph nodes, body aches, joint swelling, chest pain, shortness of breath, mood changes.  Positive muscle aches  Objective  Blood pressure 120/84, pulse 70, height 6\' 2"  (1.88 m), SpO2 99 %.    General: No apparent distress alert and oriented x3 mood and affect normal, dressed appropriately.  HEENT: Pupils equal, extraocular movements intact  Respiratory: Patient's speak in full sentences and does not appear short of breath  Cardiovascular: No lower extremity edema, non tender, no erythema  Skin: Warm dry intact with no signs of infection or rash on extremities or on axial skeleton.  Abdomen: Soft nontender  Neuro: Cranial nerves II through XII are intact, neurovascularly intact in all extremities with 2+ DTRs and 2+ pulses.  Lymph: No lymphadenopathy of posterior or anterior cervical chain or axillae bilaterally.  Gait normal with good balance and coordination.  MSK:  Non tender with full range of motion and good stability and symmetric strength and tone of shoulders, elbows,  hip, knee and ankles bilaterally.   Left thumb pain shows the patient has near full range of motion.  Patient does have some tenderness over the abductor pollicis longus tendon with a positive Finkelstein's test.  Full range of motion of the wrist otherwise. No  triggering noted.  Good grip strength  Limited musculoskeletal ultrasound was performed and interpreted by Lyndal Pulley  Limited ultrasound of patient's thumb shows hypoechoic changes of the abductor pollicis longus but no true tear appreciated.  No significant cortical breakdown noted of any of the bone.  Patient's CMC joint very mild arthritic changes.  Gouty deposits noted Impression: Mild tenosynovitis but more likely gout related deposits    97110; 15 additional minutes spent for Therapeutic exercises as stated in above notes.  This included exercises focusing on stretching, strengthening, with significant focus on eccentric aspects.   Long term goals include an improvement in range of motion, strength, endurance as well as avoiding reinjury. Patient's frequency would include in 1-2 times a day, 3-5 times a week  for a duration of 6-12 weeks. Classic tenosynovitis of the 1st dorsal compartment.  This anatomy was reviewed with the patient.  Ice bid for 2-3 days at a minimum Place the thumb in a thumb spica splint Hand exercises in a week or so - stress ball then opening hand with rubber bands  If continues to do poorly, may need to inject the 1st dorsal sheath  Proper technique shown and discussed handout in great detail with ATC.  All questions were discussed and answered.   Impression and Recommendations:     This case required medical decision making of moderate complexity. The above documentation has been reviewed and is accurate and complete Lyndal Pulley, DO       Note: This dictation was prepared with Dragon dictation along with smaller phrase technology. Any transcriptional errors that result from this process are unintentional.

## 2019-02-13 ENCOUNTER — Ambulatory Visit: Payer: Self-pay

## 2019-02-13 ENCOUNTER — Ambulatory Visit (INDEPENDENT_AMBULATORY_CARE_PROVIDER_SITE_OTHER): Payer: Medicare Other | Admitting: Family Medicine

## 2019-02-13 ENCOUNTER — Other Ambulatory Visit: Payer: Self-pay

## 2019-02-13 ENCOUNTER — Encounter: Payer: Self-pay | Admitting: Family Medicine

## 2019-02-13 VITALS — BP 120/84 | HR 70 | Ht 74.0 in

## 2019-02-13 DIAGNOSIS — M79645 Pain in left finger(s): Secondary | ICD-10-CM

## 2019-02-13 DIAGNOSIS — M109 Gout, unspecified: Secondary | ICD-10-CM | POA: Diagnosis not present

## 2019-02-13 DIAGNOSIS — E559 Vitamin D deficiency, unspecified: Secondary | ICD-10-CM

## 2019-02-13 DIAGNOSIS — G8929 Other chronic pain: Secondary | ICD-10-CM | POA: Diagnosis not present

## 2019-02-13 MED ORDER — INDOMETHACIN 50 MG PO CAPS: 50.0000 mg | ORAL_CAPSULE | Freq: Three times a day (TID) | ORAL | 3 refills | Status: DC | PRN

## 2019-02-13 NOTE — Patient Instructions (Addendum)
Good to see you.  Ice 20 minutes 2 times daily. Usually after activity and before bed. Exercises 3 times a week.  Stop the strengthening everyday and go to 3 times a week  Indomethacin 2 times a day for 5 days Add tart cherry extract any dose at night Send message in 3 weeks so I know how you are doing  Be safe

## 2019-02-13 NOTE — Assessment & Plan Note (Signed)
Encouraged once weekly vitamin D again

## 2019-02-13 NOTE — Assessment & Plan Note (Signed)
I am concerned that patient is having more of a gouty attack.  Has had difficulty with this previously.  This is on the contralateral side though.  Discussed with patient about the indomethacin regularly for the next 3 days.  Discussed taking the allopurinol and added over-the-counter medications.  Patient is in agreement with the plan.  We will try to stay hydrated.  Mild range of motion exercises but do not think bracing is necessary at this time.  Follow-up again in 4 to 6 weeks

## 2019-02-21 ENCOUNTER — Encounter: Payer: Self-pay | Admitting: Family Medicine

## 2019-02-25 DIAGNOSIS — H43393 Other vitreous opacities, bilateral: Secondary | ICD-10-CM | POA: Diagnosis not present

## 2019-02-25 DIAGNOSIS — H5203 Hypermetropia, bilateral: Secondary | ICD-10-CM | POA: Diagnosis not present

## 2019-02-25 DIAGNOSIS — H52223 Regular astigmatism, bilateral: Secondary | ICD-10-CM | POA: Diagnosis not present

## 2019-02-25 DIAGNOSIS — H524 Presbyopia: Secondary | ICD-10-CM | POA: Diagnosis not present

## 2019-03-17 ENCOUNTER — Encounter: Payer: Self-pay | Admitting: Family Medicine

## 2019-03-20 ENCOUNTER — Ambulatory Visit: Payer: Medicare Other

## 2019-03-20 ENCOUNTER — Other Ambulatory Visit: Payer: Self-pay

## 2019-03-20 VITALS — Ht 74.0 in | Wt 190.0 lb

## 2019-03-20 DIAGNOSIS — Z1211 Encounter for screening for malignant neoplasm of colon: Secondary | ICD-10-CM

## 2019-03-20 MED ORDER — SUPREP BOWEL PREP KIT 17.5-3.13-1.6 GM/177ML PO SOLN: 1.0000 | Freq: Once | ORAL | 0 refills | Status: AC

## 2019-03-20 NOTE — Progress Notes (Signed)
Per pt, no allergies to soy or egg products.Pt not taking any weight loss meds or using  O2 at home.  Pt denies sedation problems. Pt refused emmi video.  The PV was done over the phone due to COVID-19. Verified pt's address and insurance with him. Reviewed medical hx and prep instructions and will mail paperwork to the pt today. Informed pt to call with any questions or changes prior to his procedure. Pt understood.

## 2019-04-02 ENCOUNTER — Telehealth: Payer: Self-pay | Admitting: Gastroenterology

## 2019-04-02 NOTE — Telephone Encounter (Signed)

## 2019-04-03 ENCOUNTER — Encounter: Payer: Self-pay | Admitting: Gastroenterology

## 2019-04-03 ENCOUNTER — Ambulatory Visit (AMBULATORY_SURGERY_CENTER): Payer: Medicare Other | Admitting: Gastroenterology

## 2019-04-03 ENCOUNTER — Other Ambulatory Visit: Payer: Self-pay

## 2019-04-03 VITALS — BP 147/85 | HR 64 | Temp 98.5°F | Resp 17 | Ht 74.0 in | Wt 190.0 lb

## 2019-04-03 DIAGNOSIS — Z1211 Encounter for screening for malignant neoplasm of colon: Secondary | ICD-10-CM | POA: Diagnosis not present

## 2019-04-03 MED ORDER — SODIUM CHLORIDE 0.9 % IV SOLN: 500.0000 mL | Freq: Once | INTRAVENOUS | Status: DC

## 2019-04-03 NOTE — Patient Instructions (Signed)
   Information on diverticulosis and high fiber diet given to you today   YOU HAD AN ENDOSCOPIC PROCEDURE TODAY AT [facility]:   Refer to the procedure report that was given to you for any specific questions about what was found during the examination.  If the procedure report does not answer your questions, please call your gastroenterologist to clarify.  If you requested that your care partner not be given the details of your procedure findings, then the procedure report has been included in a sealed envelope for you to review at your convenience later.  YOU SHOULD EXPECT: Some feelings of bloating in the abdomen. Passage of more gas than usual.  Walking can help get rid of the air that was put into your GI tract during the procedure and reduce the bloating. If you had a lower endoscopy (such as a colonoscopy or flexible sigmoidoscopy) you may notice spotting of blood in your stool or on the toilet paper. If you underwent a bowel prep for your procedure, you may not have a normal bowel movement for a few days.  Please Note:  You might notice some irritation and congestion in your nose or some drainage.  This is from the oxygen used during your procedure.  There is no need for concern and it should clear up in a day or so.  SYMPTOMS TO REPORT IMMEDIATELY:   Following lower endoscopy (colonoscopy or flexible sigmoidoscopy):  Excessive amounts of blood in the stool  Significant tenderness or worsening of abdominal pains  Swelling of the abdomen that is new, acute  Fever of 100F or higher    For urgent or emergent issues, a gastroenterologist can be reached at any hour by calling [phone].   DIET:  We do recommend a small meal at first, but then you may proceed to your regular diet.  Drink plenty of fluids but you should avoid alcoholic beverages for 24 hours.  ACTIVITY:  You should plan to take it easy for the rest of today and you should NOT DRIVE or use heavy  machinery until tomorrow (because of the sedation medicines used during the test).    FOLLOW UP: Our staff will call the number listed on your records 48-72 hours following your procedure to check on you and address any questions or concerns that you may have regarding the information given to you following your procedure. If we do not reach you, we will leave a message.  We will attempt to reach you two times.  During this call, we will ask if you have developed any symptoms of COVID 19. If you develop any symptoms (ie: fever, flu-like symptoms, shortness of breath, cough etc.) before then, please call [phone].  If you test positive for Covid 19 in the 2 weeks post procedure, please call and report this information to [place].    If any biopsies were taken you will be contacted by phone or by letter within the next 1-3 weeks.  Please call us at [phone] if you have not heard about the biopsies in 3 weeks.    SIGNATURES/CONFIDENTIALITY: You and/or your care partner have signed paperwork which will be entered into your electronic medical record.  These signatures attest to the fact that that the information above on your After Visit Summary has been reviewed and is understood.  Full responsibility of the confidentiality of this discharge information lies with you and/or your care-partner.

## 2019-04-03 NOTE — Progress Notes (Signed)
A and O x3. Report to RN. Tolerated MAC anesthesia well.

## 2019-04-03 NOTE — Op Note (Signed)
[facility] Patient Name: George Fitzgerald Procedure Date: 04/03/2019 9:43 AM MRN: [id number] Endoscopist: Ladene Artist , MD Age: 68 Referring MD:  Date of Birth: 1950/10/10 Gender: Male Account #: [id number] Procedure:                Colonoscopy Indications:              Screening for colorectal malignant neoplasm Medicines:                Monitored Anesthesia Care Procedure:                Pre-Anesthesia Assessment:                           - Prior to the procedure, a History and Physical                            was performed, and patient medications and                            allergies were reviewed. The patient's tolerance of                            previous anesthesia was also reviewed. The risks                            and benefits of the procedure and the sedation                            options and risks were discussed with the patient.                            All questions were answered, and informed consent                            was obtained. Prior Anticoagulants: The patient has                            taken no previous anticoagulant or antiplatelet                            agents. ASA Grade Assessment: II - A patient with                            mild systemic disease. After reviewing the risks                            and benefits, the patient was deemed in                            satisfactory condition to undergo the procedure.                           After obtaining informed consent, the colonoscope  was passed under direct vision. Throughout the                            procedure, the patient's blood pressure, pulse, and                            oxygen saturations were monitored continuously. The                            Colonoscope was introduced through the anus and                            advanced to the the cecum, identified by                            appendiceal orifice and  ileocecal valve. The                            ileocecal valve, appendiceal orifice, and rectum                            were photographed. The quality of the bowel                            preparation was excellent. The colonoscopy was                            performed without difficulty. The patient tolerated                            the procedure well. Scope In: 9:53:11 AM Scope Out: 10:07:26 AM Scope Withdrawal Time: 0 hours 11 minutes 43 seconds  Total Procedure Duration: 0 hours 14 minutes 15 seconds  Findings:                 The perianal and digital rectal examinations were                            normal.                           A few medium-mouthed diverticula were found in the                            left colon.                           The exam was otherwise without abnormality on                            direct and retroflexion views. Complications:            No immediate complications. Estimated blood loss:                            None. Estimated Blood Loss:     Estimated  blood loss: none. Impression:               - Mild left colon diverticulosis.                           - Otherwise without abnormalities on direct and                            retroflexion views.                           - No specimens collected. Recommendation:           - Repeat colonoscopy in 10 years for screening                            purposes.                           - Patient has a contact number available for                            emergencies. The signs and symptoms of potential                            delayed complications were discussed with the                            patient. Return to normal activities tomorrow.                            Written discharge instructions were provided to the                            patient.                           - High fiber diet.                           - Continue present medications. Ladene Artist,  MD 04/03/2019 10:10:55 AM This report has been signed electronically.

## 2019-04-03 NOTE — Progress Notes (Signed)
Pt's states no medical or surgical changes since previsit or office visit.  Temp per Tressia Miners

## 2019-04-05 ENCOUNTER — Telehealth: Payer: Self-pay

## 2019-04-05 NOTE — Telephone Encounter (Signed)
Attempted to reach patient for post-procedure f/u call. No answer. Left message for him to please not hesitate to call us if he has any questions/concerns regarding his care or if he develops any symptoms of COVID19 in the near future.

## 2019-04-05 NOTE — Telephone Encounter (Signed)
First attempt follow up call to pt, left message to call if any problems, any covid-19 sx or exposure or dx.

## 2019-04-11 ENCOUNTER — Other Ambulatory Visit: Payer: Self-pay | Admitting: *Deleted

## 2019-04-11 MED ORDER — ATORVASTATIN CALCIUM 40 MG PO TABS: 40.0000 mg | ORAL_TABLET | Freq: Every day | ORAL | 0 refills | Status: DC

## 2019-06-06 ENCOUNTER — Other Ambulatory Visit: Payer: Self-pay

## 2019-06-06 DIAGNOSIS — Z20822 Contact with and (suspected) exposure to covid-19: Secondary | ICD-10-CM

## 2019-06-06 DIAGNOSIS — R6889 Other general symptoms and signs: Secondary | ICD-10-CM | POA: Diagnosis not present

## 2019-06-08 LAB — NOVEL CORONAVIRUS, NAA: SARS-CoV-2, NAA: NOT DETECTED

## 2019-06-21 ENCOUNTER — Other Ambulatory Visit: Payer: Self-pay

## 2019-06-21 ENCOUNTER — Encounter: Payer: Self-pay | Admitting: Family Medicine

## 2019-06-21 MED ORDER — VITAMIN D (ERGOCALCIFEROL) 1.25 MG (50000 UNIT) PO CAPS: 50000.0000 [IU] | ORAL_CAPSULE | ORAL | 0 refills | Status: DC

## 2019-07-14 ENCOUNTER — Other Ambulatory Visit: Payer: Self-pay | Admitting: Internal Medicine

## 2019-11-02 DIAGNOSIS — Z23 Encounter for immunization: Secondary | ICD-10-CM | POA: Diagnosis not present

## 2019-11-30 DIAGNOSIS — Z23 Encounter for immunization: Secondary | ICD-10-CM | POA: Diagnosis not present

## 2019-12-25 NOTE — Telephone Encounter (Signed)
Left message to call the office back to set up an appt

## 2019-12-26 ENCOUNTER — Ambulatory Visit (INDEPENDENT_AMBULATORY_CARE_PROVIDER_SITE_OTHER): Payer: Medicare Other | Admitting: Internal Medicine

## 2019-12-26 ENCOUNTER — Other Ambulatory Visit: Payer: Self-pay

## 2019-12-26 ENCOUNTER — Encounter: Payer: Self-pay | Admitting: Internal Medicine

## 2019-12-26 VITALS — BP 142/96 | HR 95 | Temp 98.2°F | Ht 74.0 in | Wt 181.0 lb

## 2019-12-26 DIAGNOSIS — E785 Hyperlipidemia, unspecified: Secondary | ICD-10-CM

## 2019-12-26 DIAGNOSIS — E538 Deficiency of other specified B group vitamins: Secondary | ICD-10-CM | POA: Diagnosis not present

## 2019-12-26 DIAGNOSIS — N320 Bladder-neck obstruction: Secondary | ICD-10-CM

## 2019-12-26 DIAGNOSIS — Z0000 Encounter for general adult medical examination without abnormal findings: Secondary | ICD-10-CM

## 2019-12-26 DIAGNOSIS — E034 Atrophy of thyroid (acquired): Secondary | ICD-10-CM | POA: Diagnosis not present

## 2019-12-26 DIAGNOSIS — M109 Gout, unspecified: Secondary | ICD-10-CM

## 2019-12-26 DIAGNOSIS — E559 Vitamin D deficiency, unspecified: Secondary | ICD-10-CM | POA: Diagnosis not present

## 2019-12-26 MED ORDER — ATORVASTATIN CALCIUM 40 MG PO TABS: ORAL_TABLET | ORAL | 3 refills | Status: DC

## 2019-12-26 MED ORDER — INDOMETHACIN 50 MG PO CAPS: 50.0000 mg | ORAL_CAPSULE | Freq: Three times a day (TID) | ORAL | 3 refills | Status: DC | PRN

## 2019-12-26 MED ORDER — COLCHICINE 0.6 MG PO TABS: 0.6000 mg | ORAL_TABLET | Freq: Four times a day (QID) | ORAL | 5 refills | Status: DC | PRN

## 2019-12-26 MED ORDER — ALLOPURINOL 300 MG PO TABS: 300.0000 mg | ORAL_TABLET | Freq: Every day | ORAL | 3 refills | Status: DC

## 2019-12-26 NOTE — Progress Notes (Signed)
Subjective:  Patient ID: George Fitzgerald, male    DOB: 06/06/1951  Age: 69 y.o. MRN: [id number]  CC: No chief complaint on file.   HPI George Fitzgerald presents for a f/u visit - hypothyroidism, dyslipidemia, B12 and Vit D def Not taking his thyroid Rx x 3 years BP nl at home  [facility] well   Outpatient Medications Prior to Visit  Medication Sig Dispense Refill  . Cholecalciferol (VITAMIN D3) 25 MCG (1000 UT) CAPS Take 3,000 Units by mouth daily.     . Cyanocobalamin (VITAMIN B-12) 1000 MCG SUBL Place 1 tablet (1,000 mcg total) under the tongue daily. 100 tablet 3  . omeprazole (PRILOSEC) 20 MG capsule Take 20 mg by mouth daily as needed.     Marland Kitchen allopurinol (ZYLOPRIM) 300 MG tablet Take 1 tablet (300 mg total) by mouth daily. 90 tablet 3  . atorvastatin (LIPITOR) 40 MG tablet TAKE 1 TABLET(40 MG) BY MOUTH DAILY 90 tablet 0  . indomethacin (INDOCIN) 50 MG capsule Take 1 capsule (50 mg total) by mouth 3 (three) times daily as needed. 60 capsule 3  . traMADol (ULTRAM) 50 MG tablet take 1-2 tablets by mouth twice a day if needed for pain maximum daily dose of 8 60 tablet 1  . Vitamin D, Ergocalciferol, (DRISDOL) 1.25 MG (50000 UT) CAPS capsule Take 1 capsule (50,000 Units total) by mouth every 7 (seven) days. 12 capsule 0  . aspirin 81 MG tablet Take 81 mg by mouth daily.      . colchicine 0.6 MG tablet Take 1 tablet (0.6 mg total) by mouth 4 (four) times daily as needed. 30 tablet 5  . levothyroxine (SYNTHROID) 25 MCG tablet Take 1 tablet (25 mcg total) by mouth daily before breakfast. (Patient not taking: Reported on 03/20/2019) 30 tablet 1   Facility-Administered Medications Prior to Visit  Medication Dose Route Frequency Provider Last Rate Last Admin  . methylPREDNISolone acetate (DEPO-MEDROL) injection 20 mg  20 mg Intra-Lesional Once Ramanda Paules V, MD      . methylPREDNISolone acetate (DEPO-MEDROL) injection 40 mg  40 mg Intra-articular Once Tiwana Chavis, Evie Lacks, MD        ROS: Review of  Systems  Constitutional: Positive for unexpected weight change. Negative for appetite change and fatigue.  HENT: Negative for congestion, nosebleeds, sneezing, sore throat and trouble swallowing.   Eyes: Negative for itching and visual disturbance.  Respiratory: Negative for cough.   Cardiovascular: Negative for chest pain, palpitations and leg swelling.  Gastrointestinal: Negative for abdominal distention, blood in stool, diarrhea and nausea.  Genitourinary: Negative for frequency and hematuria.  Musculoskeletal: Negative for back pain, gait problem, joint swelling and neck pain.  Skin: Negative for rash.  Neurological: Negative for dizziness, tremors, speech difficulty and weakness.  Psychiatric/Behavioral: Negative for agitation, dysphoric mood and sleep disturbance. The patient is not nervous/anxious.     Objective:  BP (!) 142/96 (BP Location: Left Arm, Patient Position: Sitting, Cuff Size: Normal)   Pulse 95   Temp 98.2 F (36.8 C) (Oral)   Ht 6\' 2"  (1.88 m)   Wt 181 lb (82.1 kg)   SpO2 97%   BMI 23.24 kg/m   BP Readings from Last 3 Encounters:  12/26/19 (!) 142/96  04/03/19 (!) 147/85  02/13/19 120/84    Wt Readings from Last 3 Encounters:  12/26/19 181 lb (82.1 kg)  04/03/19 190 lb (86.2 kg)  03/20/19 190 lb (86.2 kg)    Physical Exam Constitutional:      General:  He is not in acute distress.    Appearance: He is well-developed.     Comments: NAD  Eyes:     Conjunctiva/sclera: Conjunctivae normal.     Pupils: Pupils are equal, round, and reactive to light.  Neck:     Thyroid: No thyromegaly.     Vascular: No JVD.  Cardiovascular:     Rate and Rhythm: Normal rate and regular rhythm.     Heart sounds: Normal heart sounds. No murmur. No friction rub. No gallop.   Pulmonary:     Effort: Pulmonary effort is normal. No respiratory distress.     Breath sounds: Normal breath sounds. No wheezing or rales.  Chest:     Chest wall: No tenderness.  Abdominal:      General: Bowel sounds are normal. There is no distension.     Palpations: Abdomen is soft. There is no mass.     Tenderness: There is no abdominal tenderness. There is no guarding or rebound.  Musculoskeletal:        General: No tenderness. Normal range of motion.     Cervical back: Normal range of motion.  Lymphadenopathy:     Cervical: No cervical adenopathy.  Skin:    General: Skin is warm and dry.     Findings: No rash.  Neurological:     Mental Status: He is alert and oriented to person, place, and time.     Cranial Nerves: No cranial nerve deficit.     Motor: No abnormal muscle tone.     Coordination: Coordination normal.     Gait: Gait normal.     Deep Tendon Reflexes: Reflexes are normal and symmetric.  Psychiatric:        Behavior: Behavior normal.        Thought Content: Thought content normal.        Judgment: Judgment normal.   thin Rectal - per GI  Lab Results  Component Value Date   WBC 5.9 08/28/2018   HGB 14.8 08/28/2018   HCT 43.1 08/28/2018   PLT 204.0 08/28/2018   GLUCOSE 106 (H) 08/28/2018   CHOL 285 (H) 08/28/2018   TRIG 116.0 08/28/2018   HDL 88.50 08/28/2018   LDLDIRECT 131.6 03/15/2012   LDLCALC 173 (H) 08/28/2018   ALT 24 08/28/2018   AST 24 08/28/2018   NA 140 08/28/2018   K 5.1 08/28/2018   CL 103 08/28/2018   CREATININE 1.11 08/28/2018   BUN 17 08/28/2018   CO2 29 08/28/2018   TSH 3.91 08/28/2018   PSA 0.93 08/28/2018   HGBA1C 5.6 08/28/2018    DG Lumbar Spine Complete  Result Date: 06/04/2018 CLINICAL DATA:  69 year old male with right lower back pain for 2 months. No injury. Initial encounter. EXAM: LUMBAR SPINE - COMPLETE 4+ VIEW COMPARISON:  None. FINDINGS: Slight curvature lumbar spine convex left. No acute compression fracture noted. Marked L3-4 disc space narrowing with osteophyte greater on the right. 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 Right-sided facet degenerative changes. 2 mm anterior slip L5 without significant L5-S1 disc  space narrowing. Mild to moderate degenerative changes T10-11 through L1-2. Several left renal calculi measuring up to 5 mm suspected. Vascular calcifications. IMPRESSION: 1. Marked L3-4 disc space narrowing with osteophyte greater on the right. 2. 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 Right-sided facet degenerative changes. 2 mm anterior slip L5 without significant L5-S1 disc space narrowing. 3. Mild to moderate degenerative changes T10-11 through L1-2. 4. Several left renal calculi measuring up to 5 mm suspected. 5.  Aortic Atherosclerosis (ICD10-I70.0). Electronically Signed   By: Genia Del M.D.   On: 06/04/2018 12:02    Assessment & Plan:   There are no diagnoses linked to this encounter.   Meds ordered this encounter  Medications  . allopurinol (ZYLOPRIM) 300 MG tablet    Sig: Take 1 tablet (300 mg total) by mouth daily.    Dispense:  90 tablet    Refill:  3  . atorvastatin (LIPITOR) 40 MG tablet    Sig: TAKE 1 TABLET(40 MG) BY MOUTH DAILY    Dispense:  90 tablet    Refill:  3  . colchicine 0.6 MG tablet    Sig: Take 1 tablet (0.6 mg total) by mouth 4 (four) times daily as needed.    Dispense:  30 tablet    Refill:  5  . indomethacin (INDOCIN) 50 MG capsule    Sig: Take 1 capsule (50 mg total) by mouth 3 (three) times daily as needed.    Dispense:  60 capsule    Refill:  3     Follow-up: No follow-ups on file.  Walker Kehr, MD

## 2019-12-26 NOTE — Assessment & Plan Note (Signed)
Vit D 

## 2019-12-26 NOTE — Assessment & Plan Note (Addendum)
  A  cardiac CT scan for calcium scoring offered  Labs Lipitor

## 2019-12-26 NOTE — Patient Instructions (Signed)

## 2019-12-26 NOTE — Assessment & Plan Note (Signed)
Off Rx x 3 years Labs

## 2019-12-26 NOTE — Assessment & Plan Note (Addendum)
Relapsed Switch to sq B12 Labs

## 2019-12-27 ENCOUNTER — Other Ambulatory Visit: Payer: Medicare Other

## 2020-01-01 ENCOUNTER — Other Ambulatory Visit (INDEPENDENT_AMBULATORY_CARE_PROVIDER_SITE_OTHER): Payer: Medicare Other

## 2020-01-01 DIAGNOSIS — E785 Hyperlipidemia, unspecified: Secondary | ICD-10-CM | POA: Diagnosis not present

## 2020-01-01 DIAGNOSIS — N320 Bladder-neck obstruction: Secondary | ICD-10-CM

## 2020-01-01 DIAGNOSIS — E559 Vitamin D deficiency, unspecified: Secondary | ICD-10-CM | POA: Diagnosis not present

## 2020-01-01 DIAGNOSIS — E034 Atrophy of thyroid (acquired): Secondary | ICD-10-CM

## 2020-01-01 DIAGNOSIS — E538 Deficiency of other specified B group vitamins: Secondary | ICD-10-CM | POA: Diagnosis not present

## 2020-01-01 LAB — CBC WITH DIFFERENTIAL/PLATELET
Basophils Absolute: 0.1 10*3/uL (ref 0.0–0.1)
Basophils Relative: 1.5 % (ref 0.0–3.0)
Eosinophils Absolute: 0.2 10*3/uL (ref 0.0–0.7)
Eosinophils Relative: 3.9 % (ref 0.0–5.0)
HCT: 42 % (ref 39.0–52.0)
Hemoglobin: 14.4 g/dL (ref 13.0–17.0)
Lymphocytes Relative: 33.9 % (ref 12.0–46.0)
Lymphs Abs: 1.7 10*3/uL (ref 0.7–4.0)
MCHC: 34.3 g/dL (ref 30.0–36.0)
MCV: 103.2 fl — ABNORMAL HIGH (ref 78.0–100.0)
Monocytes Absolute: 0.7 10*3/uL (ref 0.1–1.0)
Monocytes Relative: 14.2 % — ABNORMAL HIGH (ref 3.0–12.0)
Neutro Abs: 2.4 10*3/uL (ref 1.4–7.7)
Neutrophils Relative %: 46.5 % (ref 43.0–77.0)
Platelets: 179 10*3/uL (ref 150.0–400.0)
RBC: 4.07 Mil/uL — ABNORMAL LOW (ref 4.22–5.81)
RDW: 13.5 % (ref 11.5–15.5)
WBC: 5.1 10*3/uL (ref 4.0–10.5)

## 2020-01-01 LAB — BASIC METABOLIC PANEL
BUN: 22 mg/dL (ref 6–23)
CO2: 26 mEq/L (ref 19–32)
Calcium: 10.1 mg/dL (ref 8.4–10.5)
Chloride: 102 mEq/L (ref 96–112)
Creatinine, Ser: 1.24 mg/dL (ref 0.40–1.50)
GFR: 57.49 mL/min — ABNORMAL LOW (ref >60.00)
Glucose, Bld: 113 mg/dL — ABNORMAL HIGH (ref 70–99)
Potassium: 5.7 mEq/L — ABNORMAL HIGH (ref 3.5–5.1)
Sodium: 137 mEq/L (ref 135–145)

## 2020-01-01 LAB — LIPID PANEL
Cholesterol: 278 mg/dL — ABNORMAL HIGH (ref 0–200)
HDL: 90.3 mg/dL (ref >39.00)
LDL Cholesterol: 174 mg/dL — ABNORMAL HIGH (ref 0–99)
NonHDL: 187.62
Total CHOL/HDL Ratio: 3
Triglycerides: 66 mg/dL (ref 0.0–149.0)
VLDL: 13.2 mg/dL (ref 0.0–40.0)

## 2020-01-01 LAB — URINALYSIS
Bilirubin Urine: NEGATIVE
Hgb urine dipstick: NEGATIVE
Ketones, ur: NEGATIVE
Leukocytes,Ua: NEGATIVE
Nitrite: NEGATIVE
Specific Gravity, Urine: 1.025 (ref 1.000–1.030)
Total Protein, Urine: NEGATIVE
Urine Glucose: NEGATIVE
Urobilinogen, UA: 0.2 (ref 0.0–1.0)
pH: 6 (ref 5.0–8.0)

## 2020-01-01 LAB — PSA: PSA: 0.91 ng/mL (ref 0.10–4.00)

## 2020-01-01 LAB — TSH: TSH: 5.74 u[IU]/mL — ABNORMAL HIGH (ref 0.35–4.50)

## 2020-01-01 LAB — HEPATIC FUNCTION PANEL
ALT: 24 U/L (ref 0–53)
AST: 27 U/L (ref 0–37)
Albumin: 4.9 g/dL (ref 3.5–5.2)
Alkaline Phosphatase: 68 U/L (ref 39–117)
Bilirubin, Direct: 0.2 mg/dL (ref 0.0–0.3)
Total Bilirubin: 1.1 mg/dL (ref 0.2–1.2)
Total Protein: 7.3 g/dL (ref 6.0–8.3)

## 2020-01-01 LAB — VITAMIN B12: Vitamin B-12: 182 pg/mL — ABNORMAL LOW (ref 211–911)

## 2020-01-01 LAB — VITAMIN D 25 HYDROXY (VIT D DEFICIENCY, FRACTURES): VITD: 46.92 ng/mL (ref 30.00–100.00)

## 2020-01-02 ENCOUNTER — Other Ambulatory Visit: Payer: Self-pay | Admitting: Internal Medicine

## 2020-01-02 DIAGNOSIS — E034 Atrophy of thyroid (acquired): Secondary | ICD-10-CM

## 2020-01-04 ENCOUNTER — Encounter: Payer: Self-pay | Admitting: Internal Medicine

## 2020-01-04 NOTE — Assessment & Plan Note (Signed)
Here for medicare wellness/physical  Diet: heart healthy  Physical activity: not sedentary  Depression/mood screen: negative  Hearing: intact to whispered voice  Visual acuity: grossly normal, performs annual eye exam  ADLs: capable  Fall risk: none  Home safety: good  Cognitive evaluation: intact to orientation, naming, recall and repetition  EOL planning: adv directives, full code/ I agree  I have personally reviewed and have noted  1. The patient's medical, surgical and social history  2. Their use of alcohol, tobacco or illicit drugs  3. Their current medications and supplements  4. The patient's functional ability including ADL's, fall risks, home safety risks and hearing or visual impairment.  5. Diet and physical activities  6. Evidence for depression or mood disorders 7. The roster of all physicians providing medical care to patient - is listed in the Snapshot section of the chart and reviewed today.    Today patient counseled on age appropriate routine health concerns for screening and prevention, each reviewed and up to date or declined. Immunizations reviewed and up to date or declined. Labs ordered and reviewed. Risk factors for depression reviewed and negative. Hearing function and visual acuity are intact. ADLs screened and addressed as needed. Functional ability and level of safety reviewed and appropriate. Education, counseling and referrals performed based on assessed risks today. Patient provided with a copy of personalized plan for preventive services.   Last colon 2020

## 2020-01-04 NOTE — Assessment & Plan Note (Signed)
No relapse 

## 2020-01-09 ENCOUNTER — Other Ambulatory Visit: Payer: Self-pay

## 2020-01-09 ENCOUNTER — Ambulatory Visit (INDEPENDENT_AMBULATORY_CARE_PROVIDER_SITE_OTHER)
Admission: RE | Admit: 2020-01-09 | Discharge: 2020-01-09 | Disposition: A | Discharge status: Home / Self Care | Payer: Self-pay | Source: Ambulatory Visit | Attending: Internal Medicine | Admitting: Internal Medicine

## 2020-01-09 DIAGNOSIS — E785 Hyperlipidemia, unspecified: Secondary | ICD-10-CM

## 2020-01-09 DIAGNOSIS — R918 Other nonspecific abnormal finding of lung field: Secondary | ICD-10-CM | POA: Diagnosis not present

## 2020-01-10 ENCOUNTER — Encounter: Payer: Self-pay | Admitting: Internal Medicine

## 2020-01-10 ENCOUNTER — Other Ambulatory Visit: Payer: Self-pay | Admitting: Internal Medicine

## 2020-01-10 DIAGNOSIS — I2510 Atherosclerotic heart disease of native coronary artery without angina pectoris: Secondary | ICD-10-CM | POA: Insufficient documentation

## 2020-01-10 DIAGNOSIS — I2583 Coronary atherosclerosis due to lipid rich plaque: Secondary | ICD-10-CM

## 2020-01-10 NOTE — Progress Notes (Signed)
Car ref

## 2020-02-18 ENCOUNTER — Encounter: Payer: Self-pay | Admitting: Internal Medicine

## 2020-02-18 ENCOUNTER — Other Ambulatory Visit: Payer: Self-pay

## 2020-02-18 ENCOUNTER — Ambulatory Visit (INDEPENDENT_AMBULATORY_CARE_PROVIDER_SITE_OTHER): Payer: Medicare Other | Admitting: Internal Medicine

## 2020-02-18 VITALS — BP 152/86 | HR 80 | Ht 74.0 in | Wt 178.0 lb

## 2020-02-18 DIAGNOSIS — I2510 Atherosclerotic heart disease of native coronary artery without angina pectoris: Secondary | ICD-10-CM

## 2020-02-18 DIAGNOSIS — E785 Hyperlipidemia, unspecified: Secondary | ICD-10-CM

## 2020-02-18 DIAGNOSIS — I2584 Coronary atherosclerosis due to calcified coronary lesion: Secondary | ICD-10-CM

## 2020-02-18 DIAGNOSIS — R42 Dizziness and giddiness: Secondary | ICD-10-CM

## 2020-02-18 NOTE — Progress Notes (Signed)
Cardiology Office Note:    Date:  02/18/2020   ID:  George Fitzgerald, DOB 1950-12-04, MRN [id number]  PCP:  Cassandria Anger, MD  Cardiologist:  No primary care provider on file.  Electrophysiologist:  None   Referring MD: Cassandria Anger, MD   Chief Complaint: Coronary artery calcifications  History of Present Illness:    George Fitzgerald is a 69 y.o. male with a history of HLD and no past cardiovascular history who presents for review of coronary artery calcifications obtained on CT calcium scoring performed by Dr. Alain Marion for further risk stratification in the setting of dyslipidemia.  CT cor cal was independently interpreted, 01/09/20: FINDINGS: Ascending Aorta: 37 mm at mid ascending aorta measured in an axial plane. Pericardium: Normal, trace pericardial fluid. Coronary arteries: Normal coronary origins. IMPRESSION: Coronary calcium score of 1173. This was 87th percentile for age and sex matched control.  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 He describes dizziness at times when rising from the exercise bike or stopping his activity. Denies concomitant chest pain or pressure. The patient denies chest pain, chest pressure, dyspnea at rest or with exertion, palpitations, PND, orthopnea, or leg swelling. Denies cough, fever, chills. Denies nausea, vomiting. Denies syncope or presyncope.   He has taken a break from his competitive practice as well as his vigorous exercise with finding of elevated coronary calcium score. We discussed this in detail as noted below.  Past Medical History:  Diagnosis Date  . Arthritis   . Ganglion cyst    L wrist, R foot  . GERD (gastroesophageal reflux disease)   . Gout   . Hyperlipidemia   . Hypothyroidism 05/06/2015  . 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 Varicose veins    L    Past Surgical History:  Procedure Laterality Date  . COLONOSCOPY    . MASS EXCISION  07/28/2011   Procedure: EXCISION MASS/  left foot;  Surgeon: Wylene Simmer, MD;  Location: [facility];  Service: Orthopedics;  Laterality: Left;  left foot ganglion cyst excision  . VARICOSE VEIN SURGERY  2011   left leg    Current Medications: Current Meds  Medication Sig  . allopurinol (ZYLOPRIM) 300 MG tablet Take 1 tablet (300 mg total) by mouth daily.  Marland Kitchen atorvastatin (LIPITOR) 40 MG tablet TAKE 1 TABLET(40 MG) BY MOUTH DAILY  . Cholecalciferol (VITAMIN D3) 25 MCG (1000 UT) CAPS Take 3,000 Units by mouth daily.   . colchicine 0.6 MG tablet Take 1 tablet (0.6 mg total) by mouth 4 (four) times daily as needed.  . Cyanocobalamin (VITAMIN B-12) 1000 MCG SUBL Place 1 tablet (1,000 mcg total) under the tongue daily.  . indomethacin (INDOCIN) 50 MG capsule Take 1 capsule (50 mg total) by mouth 3 (three) times daily as needed.  Marland Kitchen omeprazole (PRILOSEC) 20 MG capsule Take 20 mg by mouth daily as needed.      Allergies:   Patient has no known allergies.   Social History   Socioeconomic History  . Marital status: Married    Spouse name: Not on file  . Number of children: 1  . Years of education: Not on file  . Highest education level: Not on file  Occupational History  . Occupation: retired  Tobacco Use  . Smoking status: Never Smoker  . Smokeless tobacco: Never Used  Substance and Sexual Activity  . Alcohol use: Yes    Alcohol/week: 14.0 standard drinks    Types: 14 Glasses of wine per week  .  Drug use: No  . Sexual activity: Yes  Other Topics Concern  . Not on file  Social History Narrative   Regular exercise-yes,sailor   Social Determinants of Health   Financial Resource Strain:   . Difficulty of Paying Living Expenses:   Food Insecurity:   . Worried About Charity fundraiser in the Last Year:   . Arboriculturist in the Last Year:   Transportation Needs:   . Film/video editor (Medical):   Marland Kitchen Lack of Transportation (Non-Medical):   Physical Activity:   . Days of Exercise per Week:   .  Minutes of Exercise per Session:   Stress:   . Feeling of Stress :   Social Connections:   . Frequency of Communication with Friends and Family:   . Frequency of Social Gatherings with Friends and Family:   . Attends Religious Services:   . Active Member of Clubs or Organizations:   . Attends Archivist Meetings:   Marland Kitchen Marital Status:      Family History: The patient's family history includes Kidney disease (age of onset: 39) in his father; Nephritis in his father; Stroke in his mother.  ROS:   Please see the history of present illness.    All other systems reviewed and are negative.  EKGs/Labs/Other Studies Reviewed:    The following studies were reviewed today:  EKG:  SR, first deg AV block, incomplete RBBB  I have independently reviewed the images from CT coronary calcium scan.   Recent Labs: 01/01/2020: ALT 24; BUN 22; Creatinine, Ser 1.24; Hemoglobin 14.4; Platelets 179.0; Potassium 5.7 No hemolysis seen; Sodium 137; TSH 5.74  Recent Lipid Panel    Component Value Date/Time   CHOL 278 (H) 01/01/2020 0847   TRIG 66.0 01/01/2020 0847   HDL 90.30 01/01/2020 0847   CHOLHDL 3 01/01/2020 0847   VLDL 13.2 01/01/2020 0847   LDLCALC 174 (H) 01/01/2020 0847   LDLDIRECT 131.6 03/15/2012 0754    Physical Exam:    VS:  BP (!) 152/86   Pulse 80   Ht 6\' 2"  (1.88 m)   Wt 178 lb (80.7 kg)   BMI 22.85 kg/m     Wt Readings from Last 5 Encounters:  02/18/20 178 lb (80.7 kg)  12/26/19 181 lb (82.1 kg)  04/03/19 190 lb (86.2 kg)  03/20/19 190 lb (86.2 kg)  08/28/18 198 lb (89.8 kg)     Constitutional: No acute distress Eyes: sclera non-icteric, normal conjunctiva and lids ENMT: normal dentition, moist mucous membranes Cardiovascular: regular rhythm, normal rate, no murmurs. S1 and S2 normal. Radial pulses normal bilaterally. No jugular venous distention.  Respiratory: clear to auscultation bilaterally GI : normal bowel sounds, soft and nontender. No distention.     MSK: extremities warm, well perfused. No edema.  NEURO: grossly nonfocal exam, moves all extremities. PSYCH: alert and oriented x 3, normal mood and affect.   ASSESSMENT:    1. Coronary artery calcification   2. CAD in native artery   3. Dizziness   4. Dyslipidemia    PLAN:    Coronary artery calcification - Plan: EKG 12-Lead, MYOCARDIAL PERFUSION IMAGING CAD in native artery Dizziness  - His calcium score is elevated in a high risk category. We discussed the natural history of coronary artery calcifications. We discussed that this can be an incidental finding, but does indicate coronary artery disease. Detection of coronary artery calcifications does not inform degree of coronary artery stenosis. Goal directed medical therapy is  indicated for secondary prevention of CAD including in many cases low dose aspirin and moderate-high intensity statin therapy, if tolerated by the patient. We discussed risk stratification of coronary calcium score and MESA database percentile compared to age and sex-matched peers for the validated age group when coronary calcium scoring is performed. The absence of coronary artery calcifications does not exclude the possibility of coronary artery disease with non-calcified "soft" plaque. We reviewed that ischemic testing is not required in the absence of symptoms, however further risk stratification is sometimes indicated with dense calcification in high risk locations such as left main coronary artery. We discussed indications for further ischemic workup.  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  Dyslipidemia - he has been initiated on atorvastatin 40 mg daily, tolerating well. Will need repeat labs in 3 mo.   Total time of encounter: 45 minutes total time of encounter, including 35 minutes spent in face-to-face patient care on the date of this encounter. This time includes  coordination of care and counseling regarding above mentioned problem list. Remainder of non-face-to-face time involved reviewing chart documents/testing relevant to the patient encounter and documentation in the medical record. I have independently reviewed documentation from referring provider.   Cherlynn Kaiser, MD [place]  [facility]    Medication Adjustments/Labs and Tests Ordered: Current medicines are reviewed at length with the patient today.  Concerns regarding medicines are outlined above.  Orders Placed This Encounter  Procedures  . MYOCARDIAL PERFUSION IMAGING  . EKG 12-Lead   No orders of the defined types were placed in this encounter.   Patient Instructions  Medication Instructions:  Your physician recommends that you continue on your current medications as directed. Please refer to the Current Medication list given to you today.  *If you need a refill on your cardiac medications before your next appointment, please call your pharmacy*  Testing/Procedures: Your physician has requested that you have a Lexicographer (at [employer]). For further information please visit [email or web address]. Please follow instruction sheet, as given.  Follow-Up: At [facility], you and your health needs are our priority.  As part of our continuing mission to provide you with exceptional heart care, we have created designated Provider Care Teams.  These Care Teams include your primary Cardiologist (physician) and Advanced Practice Providers (APPs -  Physician Assistants and Nurse Practitioners) who all work together to provide you with the care you need, when you need it.  We recommend signing up for the patient portal called "MyChart".  Sign up information is provided on this After Visit Summary.  MyChart is used to connect with patients for Virtual Visits (Telemedicine).  Patients are able to view lab/test results, encounter notes, upcoming appointments, etc.  Non-urgent  messages can be sent to your provider as well.   To learn more about what you can do with MyChart, go to [email or web address].    Your next appointment:   After stress test with Dr. Margaretann Loveless

## 2020-02-18 NOTE — Patient Instructions (Signed)
Medication Instructions:  Your physician recommends that you continue on your current medications as directed. Please refer to the Current Medication list given to you today.  *If you need a refill on your cardiac medications before your next appointment, please call your pharmacy*  Testing/Procedures: Your physician has requested that you have a Lexicographer (at [employer]). For further information please visit [email or web address]. Please follow instruction sheet, as given.  Follow-Up: At [facility], you and your health needs are our priority.  As part of our continuing mission to provide you with exceptional heart care, we have created designated Provider Care Teams.  These Care Teams include your primary Cardiologist (physician) and Advanced Practice Providers (APPs -  Physician Assistants and Nurse Practitioners) who all work together to provide you with the care you need, when you need it.  We recommend signing up for the patient portal called "MyChart".  Sign up information is provided on this After Visit Summary.  MyChart is used to connect with patients for Virtual Visits (Telemedicine).  Patients are able to view lab/test results, encounter notes, upcoming appointments, etc.  Non-urgent messages can be sent to your provider as well.   To learn more about what you can do with MyChart, go to [email or web address].    Your next appointment:   After stress test with Dr. Margaretann Loveless

## 2020-02-27 ENCOUNTER — Telehealth (HOSPITAL_COMMUNITY): Payer: Self-pay

## 2020-02-27 ENCOUNTER — Encounter (HOSPITAL_COMMUNITY): Payer: Self-pay

## 2020-02-27 NOTE — Telephone Encounter (Signed)
Patient requested a letter on my chart. He stated that he would be here for his test. Asked to call back with any questions. S.Gwenette Wellons EMTP

## 2020-02-28 DIAGNOSIS — H52223 Regular astigmatism, bilateral: Secondary | ICD-10-CM | POA: Diagnosis not present

## 2020-02-28 DIAGNOSIS — H524 Presbyopia: Secondary | ICD-10-CM | POA: Diagnosis not present

## 2020-02-28 DIAGNOSIS — H26033 Infantile and juvenile nuclear cataract, bilateral: Secondary | ICD-10-CM | POA: Diagnosis not present

## 2020-02-28 DIAGNOSIS — H2513 Age-related nuclear cataract, bilateral: Secondary | ICD-10-CM | POA: Diagnosis not present

## 2020-02-28 DIAGNOSIS — H5203 Hypermetropia, bilateral: Secondary | ICD-10-CM | POA: Diagnosis not present

## 2020-03-03 ENCOUNTER — Other Ambulatory Visit: Payer: Self-pay

## 2020-03-03 ENCOUNTER — Ambulatory Visit (HOSPITAL_COMMUNITY): Payer: Medicare Other | Attending: Cardiology

## 2020-03-03 DIAGNOSIS — R42 Dizziness and giddiness: Secondary | ICD-10-CM | POA: Diagnosis not present

## 2020-03-03 DIAGNOSIS — R931 Abnormal findings on diagnostic imaging of heart and coronary circulation: Secondary | ICD-10-CM | POA: Insufficient documentation

## 2020-03-03 DIAGNOSIS — I2510 Atherosclerotic heart disease of native coronary artery without angina pectoris: Secondary | ICD-10-CM

## 2020-03-03 LAB — MYOCARDIAL PERFUSION IMAGING
LV dias vol: 119 mL (ref 62–150)
LV sys vol: 47 mL
Peak HR: 85 {beats}/min
Rest HR: 55 {beats}/min
SDS: 0
SRS: 0
SSS: 0
TID: 1.16

## 2020-03-03 MED ORDER — TECHNETIUM TC 99M TETROFOSMIN IV KIT
10.5000 | PACK | Freq: Once | INTRAVENOUS | Status: AC | PRN
  Administered 2020-03-03: 10.5 via INTRAVENOUS
  Filled 2020-03-03: qty 11

## 2020-03-03 MED ORDER — TECHNETIUM TC 99M TETROFOSMIN IV KIT
32.8000 | PACK | Freq: Once | INTRAVENOUS | Status: AC | PRN
  Administered 2020-03-03: 32.8 via INTRAVENOUS
  Filled 2020-03-03: qty 33

## 2020-03-03 MED ORDER — REGADENOSON 0.4 MG/5ML IV SOLN
0.4000 mg | Freq: Once | INTRAVENOUS | Status: AC
  Administered 2020-03-03: 0.4 mg via INTRAVENOUS

## 2020-03-11 ENCOUNTER — Telehealth: Payer: Self-pay | Admitting: *Deleted

## 2020-03-11 ENCOUNTER — Telehealth (INDEPENDENT_AMBULATORY_CARE_PROVIDER_SITE_OTHER): Payer: Medicare Other | Admitting: Internal Medicine

## 2020-03-11 ENCOUNTER — Encounter: Payer: Self-pay | Admitting: Internal Medicine

## 2020-03-11 VITALS — BP 150/80 | HR 62 | Ht 74.0 in | Wt 176.0 lb

## 2020-03-11 DIAGNOSIS — R42 Dizziness and giddiness: Secondary | ICD-10-CM

## 2020-03-11 DIAGNOSIS — I10 Essential (primary) hypertension: Secondary | ICD-10-CM | POA: Diagnosis not present

## 2020-03-11 DIAGNOSIS — E785 Hyperlipidemia, unspecified: Secondary | ICD-10-CM

## 2020-03-11 DIAGNOSIS — I2584 Coronary atherosclerosis due to calcified coronary lesion: Secondary | ICD-10-CM

## 2020-03-11 DIAGNOSIS — I2510 Atherosclerotic heart disease of native coronary artery without angina pectoris: Secondary | ICD-10-CM | POA: Diagnosis not present

## 2020-03-11 MED ORDER — AMLODIPINE BESYLATE 5 MG PO TABS: 5.0000 mg | ORAL_TABLET | Freq: Every day | ORAL | 6 refills | Status: DC

## 2020-03-11 NOTE — Telephone Encounter (Signed)
RN spoke to patient. Instruction were given  from today's virtual visit 03/11/20 .  AVS SUMMARY has been sent by mychart . Patient will pick up prescription from  From desk on 03/12/20 F/U  Appointment reschedule for 03/15/20 at 3:40 pm  Patient verbalized understanding

## 2020-03-11 NOTE — Telephone Encounter (Signed)
°  Patient Consent for Virtual Visit         George Fitzgerald has provided verbal consent on 03/11/2020 for a virtual visit (video or telephone).   CONSENT FOR VIRTUAL VISIT FOR:  George Fitzgerald  By participating in this virtual visit I agree to the following:  I hereby voluntarily request, consent and authorize [facility] and its employed or contracted physicians, physician assistants, nurse practitioners or other licensed health care professionals (the Practitioner), to provide me with telemedicine health care services (the Services") as deemed necessary by the treating Practitioner. I acknowledge and consent to receive the Services by the Practitioner via telemedicine. I understand that the telemedicine visit will involve communicating with the Practitioner through live audiovisual communication technology and the disclosure of certain medical information by electronic transmission. I acknowledge that I have been given the opportunity to request an in-person assessment or other available alternative prior to the telemedicine visit and am voluntarily participating in the telemedicine visit.  I understand that I have the right to withhold or withdraw my consent to the use of telemedicine in the course of my care at any time, without affecting my right to future care or treatment, and that the Practitioner or I may terminate the telemedicine visit at any time. I understand that I have the right to inspect all information obtained and/or recorded in the course of the telemedicine visit and may receive copies of available information for a reasonable fee.  I understand that some of the potential risks of receiving the Services via telemedicine include:   Delay or interruption in medical evaluation due to technological equipment failure or disruption;  Information transmitted may not be sufficient (e.g. poor resolution of images) to allow for appropriate medical decision making by the Practitioner; and/or     In rare instances, security protocols could fail, causing a breach of personal health information.  Furthermore, I acknowledge that it is my responsibility to provide information about my medical history, conditions and care that is complete and accurate to the best of my ability. I acknowledge that Practitioner's advice, recommendations, and/or decision may be based on factors not within their control, such as incomplete or inaccurate data provided by me or distortions of diagnostic images or specimens that may result from electronic transmissions. I understand that the practice of medicine is not an exact science and that Practitioner makes no warranties or guarantees regarding treatment outcomes. I acknowledge that a copy of this consent can be made available to me via my patient portal ([facility]), or I can request a printed copy by calling the office of [facility].    I understand that my insurance will be billed for this visit.   I have read or had this consent read to me.  I understand the contents of this consent, which adequately explains the benefits and risks of the Services being provided via telemedicine.   I have been provided ample opportunity to ask questions regarding this consent and the Services and have had my questions answered to my satisfaction.  I give my informed consent for the services to be provided through the use of telemedicine in my medical care

## 2020-03-11 NOTE — Progress Notes (Addendum)
Virtual Visit via Telephone Note   This visit type was conducted due to national recommendations for restrictions regarding the COVID-19 Pandemic (e.g. social distancing) in an effort to limit this patient's exposure and mitigate transmission in our community.  Due to his co-morbid illnesses, this patient is at least at moderate risk for complications without adequate follow up.  This format is felt to be most appropriate for this patient at this time.  The patient did not have access to video technology/had technical difficulties with video requiring transitioning to audio format only (telephone).  All issues noted in this document were discussed and addressed.  No physical exam could be performed with this format.  Please refer to the patient's chart for his  consent to telehealth for [facility].   The patient was identified using 2 identifiers.  Date:  03/11/2020   George Fitzgerald:  George Fitzgerald, DOB Jul 04, 1951, MRN [id number]  Patient Location: Home Provider Location: Office  PCP:  Cassandria Anger, MD  Cardiologist:  No primary care provider on file.  Electrophysiologist:  None   Evaluation Performed:  Follow-Up Visit  Chief Complaint:  F/u stress test  History of Present Illness:    George Fitzgerald is a 69 y.o. male with history of coronary artery calcifications and hyperlipidemia who presents for follow up of testing.   Reviewed medications and supplements in detail.   We reviewed the results of stress testing which showed normal perfusion images. We discussed that it is reasonable to return to activities and closely monitor symptoms. Red flag symptoms reviewed in detail. We did not perform a treadmill test due to patient's orthopedic limitation and patient preference.  George Fitzgerald was concerned about elevated blood pressure on stress test. We discussed supine vs upright blood pressure recordings, but he notes that BP has been high over several visits and wonders if he should start anti  hypertensive therapy.   Denies chest pain, shortness of breath, palpitations, syncope. Mild persistent positional dizziness.  The patient does not have symptoms concerning for COVID-19 infection (fever, chills, cough, or new shortness of breath).    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   aspirin EC 81 MG tablet Take 81 mg by mouth daily. Swallow whole.   atorvastatin (LIPITOR) 40 MG tablet TAKE 1 TABLET(40 MG) BY MOUTH DAILY   Cholecalciferol (VITAMIN D3) 25 MCG (1000 UT) CAPS Take 3,000 Units by mouth daily.    colchicine 0.6 MG tablet Take 1 tablet (0.6 mg total) by mouth 4 (four) times daily as needed.   Cyanocobalamin (VITAMIN B-12) 1000 MCG SUBL Place 1 tablet (1,000 mcg total) under the tongue daily.   indomethacin (INDOCIN) 50 MG capsule Take 1 capsule (50 mg total) by mouth  3 (three) times daily as needed.   omeprazole (PRILOSEC) 20 MG capsule Take 20 mg by mouth daily as needed.      Allergies:   Patient has no known allergies.   Social History   Tobacco Use   Smoking status: Never Smoker   Smokeless tobacco: Never Used  Substance Use Topics   Alcohol use: Yes    Alcohol/week: 14.0 standard drinks    Types: 14 Glasses of wine per week   Drug use: No     Family Hx: The patient's family history  includes Kidney disease (age of onset: 71) in his father; Nephritis in his father; Stroke in his mother.  ROS:   Please see the history of present illness.     All other systems reviewed and are negative.   Prior CV studies:   The following studies were reviewed today:  Lexiscan Myoview  Labs/Other Tests and Data Reviewed:    EKG:  No ECG reviewed.  Recent Labs: 01/01/2020: ALT 24; BUN 22; Creatinine, Ser 1.24; Hemoglobin 14.4; Platelets 179.0; Potassium 5.7 No hemolysis seen; Sodium 137; TSH 5.74   Recent Lipid Panel Lab Results  Component Value Date/Time   CHOL 278 (H) 01/01/2020 08:47 AM   TRIG 66.0 01/01/2020 08:47 AM   HDL 90.30 01/01/2020 08:47 AM   CHOLHDL 3 01/01/2020 08:47 AM   LDLCALC 174 (H) 01/01/2020 08:47 AM   LDLDIRECT 131.6 03/15/2012 07:54 AM    Wt Readings from Last 3 Encounters:  03/11/20 176 lb (79.8 kg)  03/03/20 178 lb (80.7 kg)  02/18/20 178 lb (80.7 kg)     Objective:    Vital Signs:  BP (!) 150/80 Comment: patient took 3 diferent readings 155/82, 148/80   Pulse 62    Ht 6\' 2"  (1.88 m)    Wt 176 lb (79.8 kg)    BMI 22.60 kg/m    VITAL SIGNS:  reviewed GEN:  no acute distress RESPIRATORY:  normal respiratory effort, no increased work of breathing NEURO:  alert and oriented x 3, speech normal PSYCH:  normal affect   ASSESSMENT & PLAN:    Essential hypertension - Plan: amLODipine (NORVASC) 5 MG tablet - serial BP measurements are elevated >140/80 suggesting mild underlying hypertension. Initiate amlodipine 5 mg daily and monitor response.   Coronary artery calcification - Plan: aspirin EC 81 MG tablet  CAD in native artery  Dyslipidemia - continue atorvastatin . Diet and lifestyle for optimal cardiovascular health reviewed in detail.  Dizziness - observe, no concerning features of left main or equivalent disease contributing at this time.   COVID-19 Education: The signs and symptoms of COVID-19 were discussed with the patient and how  to seek care for testing (follow up with PCP or arrange E-visit).  The importance of social distancing was discussed today.  Time:   Today, I have spent 30 minutes on today's encounter, 25 mintues with the patient with telehealth technology discussing the above problems.     Medication Adjustments/Labs and Tests Ordered: Current medicines are reviewed at length with the patient today.  Concerns regarding medicines are outlined above.   Tests Ordered: No orders of the defined types were placed in this encounter.   Medication Changes: Meds ordered this encounter  Medications   amLODipine (NORVASC) 5 MG tablet    Sig: Take 1 tablet (5 mg total) by mouth daily.    Dispense:  30 tablet    Refill:  6     Follow Up: 1 mo  Signed, Nadean Corwin  Stann Mainland, MD  03/11/2020 8:41 AM    [facility]

## 2020-03-11 NOTE — Patient Instructions (Addendum)
       Medication Instructions:  Start taking Amlodipine  5 mg one tablet daily  For hypertension *If you need a refill on your cardiac medications before your next appointment, please call your pharmacy*   Lab Work: Not needed    Testing/Procedures: Not needed   Follow-Up: At [facility], you and your health needs are our priority.  As part of our continuing mission to provide you with exceptional heart care, we have created designated Provider Care Teams.  These Care Teams include your primary Cardiologist (physician) and Advanced Practice Providers (APPs -  Physician Assistants and Nurse Practitioners) who all work together to provide you with the care you need, when you need it.    Your next appointment:   1 month(s)  The format for your next appointment:   Either In Person or Virtual  Provider:   Cherlynn Kaiser, MD  Other instructions: Safe to return to exercise, instructed to call our office with chest pain or Shortness of breath.

## 2020-03-24 ENCOUNTER — Ambulatory Visit: Payer: Medicare Other | Admitting: Internal Medicine

## 2020-04-14 ENCOUNTER — Other Ambulatory Visit: Payer: Self-pay

## 2020-04-14 ENCOUNTER — Encounter: Payer: Self-pay | Admitting: Internal Medicine

## 2020-04-14 ENCOUNTER — Ambulatory Visit (INDEPENDENT_AMBULATORY_CARE_PROVIDER_SITE_OTHER): Payer: Medicare Other | Admitting: Internal Medicine

## 2020-04-14 VITALS — BP 152/84 | HR 88 | Ht 74.0 in | Wt 174.8 lb

## 2020-04-14 DIAGNOSIS — I2510 Atherosclerotic heart disease of native coronary artery without angina pectoris: Secondary | ICD-10-CM

## 2020-04-14 DIAGNOSIS — E785 Hyperlipidemia, unspecified: Secondary | ICD-10-CM | POA: Diagnosis not present

## 2020-04-14 DIAGNOSIS — I2584 Coronary atherosclerosis due to calcified coronary lesion: Secondary | ICD-10-CM | POA: Diagnosis not present

## 2020-04-14 DIAGNOSIS — Z79899 Other long term (current) drug therapy: Secondary | ICD-10-CM | POA: Diagnosis not present

## 2020-04-14 DIAGNOSIS — I10 Essential (primary) hypertension: Secondary | ICD-10-CM

## 2020-04-14 MED ORDER — LOSARTAN POTASSIUM 25 MG PO TABS: 25.0000 mg | ORAL_TABLET | Freq: Every day | ORAL | 3 refills | Status: DC

## 2020-04-14 NOTE — Progress Notes (Signed)
Cardiology Office Note:    Date:  04/14/2020   ID:  [name], DOB 1950-12-07, MRN [id number]  PCP:  Cassandria Anger, MD  Cardiologist:  No primary care provider on file.  Electrophysiologist:  None   Referring MD: Cassandria Anger, MD   Chief Complaint: CAC, elevated BP  History of Present Illness:    George Fitzgerald is a 69 y.o. male with a history of coronary artery calcifications and hyperlipidemia who presents for follow up.  Had LE edema from amlodipine, we discuss transitioning to alternate agent. Otherwise BP well controlled on therapy. Reviewed average blood pressure readings.  The patient denies chest pain, chest pressure, dyspnea at rest or with exertion, palpitations, PND, orthopnea, or leg swelling. Denies cough, fever, chills. Denies nausea, vomiting. Denies syncope or presyncope. Denies dizziness or lightheadedness. Denies snoring.  Past Medical History:  Diagnosis Date  . Arthritis   . Ganglion cyst    L wrist, R foot  . GERD (gastroesophageal reflux disease)   . Gout   . Hyperlipidemia   . Hypothyroidism 05/06/2015  . 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 Varicose veins    L    Past Surgical History:  Procedure Laterality Date  . COLONOSCOPY    . MASS EXCISION  07/28/2011   Procedure: EXCISION MASS/ left foot;  Surgeon: Wylene Simmer, MD;  Location: [facility];  Service: Orthopedics;  Laterality: Left;  left foot ganglion cyst excision  . VARICOSE VEIN SURGERY  2011   left leg    Current Medications: Current Meds  Medication Sig  . allopurinol (ZYLOPRIM) 300 MG tablet Take 1 tablet (300 mg total) by mouth daily.  Marland Kitchen amLODipine (NORVASC) 5 MG tablet Take 1 tablet (5 mg total) by mouth daily.  Marland Kitchen aspirin EC 81 MG tablet Take 81 mg by mouth daily. Swallow whole.  Marland Kitchen atorvastatin (LIPITOR) 40 MG tablet TAKE 1 TABLET(40 MG) BY MOUTH DAILY  . Cholecalciferol (VITAMIN D3) 25 MCG (1000 UT) CAPS Take 3,000 Units by mouth daily.   .  colchicine 0.6 MG tablet Take 1 tablet (0.6 mg total) by mouth 4 (four) times daily as needed.  . Cyanocobalamin (VITAMIN B-12) 1000 MCG SUBL Place 1 tablet (1,000 mcg total) under the tongue daily.  . indomethacin (INDOCIN) 50 MG capsule Take 1 capsule (50 mg total) by mouth 3 (three) times daily as needed.  Marland Kitchen omeprazole (PRILOSEC) 20 MG capsule Take 20 mg by mouth daily as needed.      Allergies:   Patient has no known allergies.   Social History   Socioeconomic History  . Marital status: Married    Spouse name: Not on file  . Number of children: 1  . Years of education: Not on file  . Highest education level: Not on file  Occupational History  . Occupation: retired  Tobacco Use  . Smoking status: Never Smoker  . Smokeless tobacco: Never Used  Substance and Sexual Activity  . Alcohol use: Yes    Alcohol/week: 14.0 standard drinks    Types: 14 Glasses of wine per week  . Drug use: No  . Sexual activity: Yes  Other Topics Concern  . Not on file  Social History Narrative   Regular exercise-yes,sailor   Social Determinants of Health   Financial Resource Strain:   . Difficulty of Paying Living Expenses:   Food Insecurity:   . Worried About Charity fundraiser in the Last Year:   . [place] in the  Last Year:   Transportation Needs:   . Film/video editor (Medical):   Marland Kitchen Lack of Transportation (Non-Medical):   Physical Activity:   . Days of Exercise per Week:   . Minutes of Exercise per Session:   Stress:   . Feeling of Stress :   Social Connections:   . Frequency of Communication with Friends and Family:   . Frequency of Social Gatherings with Friends and Family:   . Attends Religious Services:   . Active Member of Clubs or Organizations:   . Attends Archivist Meetings:   Marland Kitchen Marital Status:      Family History: The patient's family history includes Kidney disease (age of onset: 68) in his father; Nephritis in his father; Stroke in his  mother.  ROS:   Please see the history of present illness.    All other systems reviewed and are negative.  EKGs/Labs/Other Studies Reviewed:    The following studies were reviewed today:  Recent Labs: 01/01/2020: ALT 24; BUN 22; Creatinine, Ser 1.24; Hemoglobin 14.4; Platelets 179.0; Potassium 5.7 No hemolysis seen; Sodium 137; TSH 5.74  Recent Lipid Panel    Component Value Date/Time   CHOL 278 (H) 01/01/2020 0847   TRIG 66.0 01/01/2020 0847   HDL 90.30 01/01/2020 0847   CHOLHDL 3 01/01/2020 0847   VLDL 13.2 01/01/2020 0847   LDLCALC 174 (H) 01/01/2020 0847   LDLDIRECT 131.6 03/15/2012 0754    Physical Exam:    VS:  BP (!) 152/84   Pulse 88   Ht 6\' 2"  (1.88 m)   Wt 174 lb 12.8 oz (79.3 kg)   SpO2 98%   BMI 22.44 kg/m     Wt Readings from Last 5 Encounters:  04/14/20 174 lb 12.8 oz (79.3 kg)  03/11/20 176 lb (79.8 kg)  03/03/20 178 lb (80.7 kg)  02/18/20 178 lb (80.7 kg)  12/26/19 181 lb (82.1 kg)     Constitutional: No acute distress Eyes: sclera non-icteric, normal conjunctiva and lids ENMT: normal dentition, moist mucous membranes Cardiovascular: regular rhythm, normal rate, no murmurs. S1 and S2 normal. Radial pulses normal bilaterally. No jugular venous distention.  Respiratory: clear to auscultation bilaterally GI : normal bowel sounds, soft and nontender. No distention.   MSK: extremities warm, well perfused. No edema.  NEURO: grossly nonfocal exam, moves all extremities. PSYCH: alert and oriented x 3, normal mood and affect.   ASSESSMENT:    1. Medication management   2. Essential hypertension   3. Coronary artery calcification   4. CAD in native artery   5. Dyslipidemia    PLAN:    Medication management - Plan: Lipid panel, Basic metabolic panel, Uric acid Essential hypertension - will switch amlodipine to losartan and monitor for resolution of symptoms. -labs 7-10 days after losartan to ensure appropriate Cr, K. -keep home BP log  Coronary  artery calcification CAD in native artery Dyslipidemia - ASA 81 mg daily and atorvastatin 40 mg daily. If well tolerated can consider intensifying statin further.   Total time of encounter: 30 minutes total time of encounter, including 25 minutes spent in face-to-face patient care on the date of this encounter. This time includes coordination of care and counseling regarding above mentioned problem list. Remainder of non-face-to-face time involved reviewing chart documents/testing relevant to the patient encounter and documentation in the medical record. I have independently reviewed documentation from referring provider.   Cherlynn Kaiser, MD [place]  [facility]    Medication Adjustments/Labs and Tests  Ordered: Current medicines are reviewed at length with the patient today.  Concerns regarding medicines are outlined above.  Orders Placed This Encounter  Procedures  . Lipid panel  . Basic metabolic panel  . Uric acid   Meds ordered this encounter  Medications  . losartan (COZAAR) 25 MG tablet    Sig: Take 1 tablet (25 mg total) by mouth daily.    Dispense:  90 tablet    Refill:  3    Patient Instructions  Medication Instructions:  Stop Amlodipine 50 mg daily Start Losartan 25 mg daily  *If you need a refill on your cardiac medications before your next appointment, please call your pharmacy*   Lab Work: Your physician recommends that you return for lab work in 7-10 day ( BMP, Lipid, Uric Acid)  If you have labs (blood work) drawn today and your tests are completely normal, you will receive your results only by: Marland Kitchen MyChart Message (if you have MyChart) OR . A paper copy in the mail If you have any lab test that is abnormal or we need to change your treatment, we will call you to review the results.   Testing/Procedures: None   Follow-Up: At [facility], you and your health needs are our priority.  As part of our continuing mission to provide you with  exceptional heart care, we have created designated Provider Care Teams.  These Care Teams include your primary Cardiologist (physician) and Advanced Practice Providers (APPs -  Physician Assistants and Nurse Practitioners) who all work together to provide you with the care you need, when you need it.  We recommend signing up for the patient portal called "MyChart".  Sign up information is provided on this After Visit Summary.  MyChart is used to connect with patients for Virtual Visits (Telemedicine).  Patients are able to view lab/test results, encounter notes, upcoming appointments, etc.  Non-urgent messages can be sent to your provider as well.   To learn more about what you can do with MyChart, go to [email or web address].    Your next appointment:   May 13, 2020 @ 9: 20 am  The format for your next appointment:   Virtual Visit   Provider:   Cherlynn Kaiser, MD

## 2020-04-14 NOTE — Patient Instructions (Signed)
Medication Instructions:  Stop Amlodipine 50 mg daily Start Losartan 25 mg daily  *If you need a refill on your cardiac medications before your next appointment, please call your pharmacy*   Lab Work: Your physician recommends that you return for lab work in 7-10 day ( BMP, Lipid, Uric Acid)  If you have labs (blood work) drawn today and your tests are completely normal, you will receive your results only by: Marland Kitchen MyChart Message (if you have MyChart) OR . A paper copy in the mail If you have any lab test that is abnormal or we need to change your treatment, we will call you to review the results.   Testing/Procedures: None   Follow-Up: At [facility], you and your health needs are our priority.  As part of our continuing mission to provide you with exceptional heart care, we have created designated Provider Care Teams.  These Care Teams include your primary Cardiologist (physician) and Advanced Practice Providers (APPs -  Physician Assistants and Nurse Practitioners) who all work together to provide you with the care you need, when you need it.  We recommend signing up for the patient portal called "MyChart".  Sign up information is provided on this After Visit Summary.  MyChart is used to connect with patients for Virtual Visits (Telemedicine).  Patients are able to view lab/test results, encounter notes, upcoming appointments, etc.  Non-urgent messages can be sent to your provider as well.   To learn more about what you can do with MyChart, go to [email or web address].    Your next appointment:   May 13, 2020 @ 9: 20 am  The format for your next appointment:   Virtual Visit   Provider:   Cherlynn Kaiser, MD

## 2020-05-11 DIAGNOSIS — Z79899 Other long term (current) drug therapy: Secondary | ICD-10-CM | POA: Diagnosis not present

## 2020-05-12 LAB — URIC ACID: Uric Acid: 3.9 mg/dL (ref 3.8–8.4)

## 2020-05-12 LAB — BASIC METABOLIC PANEL WITH GFR
BUN/Creatinine Ratio: 20 (ref 10–24)
BUN: 18 mg/dL (ref 8–27)
CO2: 25 mmol/L (ref 20–29)
Calcium: 9.8 mg/dL (ref 8.6–10.2)
Chloride: 101 mmol/L (ref 96–106)
Creatinine, Ser: 0.89 mg/dL (ref 0.76–1.27)
GFR calc Af Amer: 99 mL/min/{1.73_m2}
GFR calc non Af Amer: 86 mL/min/{1.73_m2}
Glucose: 88 mg/dL (ref 65–99)
Potassium: 5.4 mmol/L — ABNORMAL HIGH (ref 3.5–5.2)
Sodium: 140 mmol/L (ref 134–144)

## 2020-05-12 LAB — LIPID PANEL
Chol/HDL Ratio: 1.4 ratio (ref 0.0–5.0)
Cholesterol, Total: 183 mg/dL (ref 100–199)
HDL: 128 mg/dL
LDL Chol Calc (NIH): 46 mg/dL (ref 0–99)
Triglycerides: 40 mg/dL (ref 0–149)
VLDL Cholesterol Cal: 9 mg/dL (ref 5–40)

## 2020-05-13 ENCOUNTER — Telehealth (INDEPENDENT_AMBULATORY_CARE_PROVIDER_SITE_OTHER): Payer: Medicare Other | Admitting: Internal Medicine

## 2020-05-13 VITALS — BP 109/62 | HR 80 | Ht 74.0 in | Wt 178.0 lb

## 2020-05-13 DIAGNOSIS — E785 Hyperlipidemia, unspecified: Secondary | ICD-10-CM

## 2020-05-13 DIAGNOSIS — I2584 Coronary atherosclerosis due to calcified coronary lesion: Secondary | ICD-10-CM | POA: Diagnosis not present

## 2020-05-13 DIAGNOSIS — Z79899 Other long term (current) drug therapy: Secondary | ICD-10-CM

## 2020-05-13 DIAGNOSIS — I2510 Atherosclerotic heart disease of native coronary artery without angina pectoris: Secondary | ICD-10-CM | POA: Diagnosis not present

## 2020-05-13 DIAGNOSIS — I10 Essential (primary) hypertension: Secondary | ICD-10-CM

## 2020-05-13 DIAGNOSIS — E7889 Other lipoprotein metabolism disorders: Secondary | ICD-10-CM | POA: Diagnosis not present

## 2020-05-13 DIAGNOSIS — I8312 Varicose veins of left lower extremity with inflammation: Secondary | ICD-10-CM

## 2020-05-13 NOTE — Progress Notes (Signed)
Virtual Visit via Telephone Note   This visit type was conducted due to national recommendations for restrictions regarding the COVID-19 Pandemic (e.g. social distancing) in an effort to limit this patient's exposure and mitigate transmission in our community.  Due to his co-morbid illnesses, this patient is at least at moderate risk for complications without adequate follow up.  This format is felt to be most appropriate for this patient at this time.  The patient did not have access to video technology/had technical difficulties with video requiring transitioning to audio format only (telephone).  All issues noted in this document were discussed and addressed.  No physical exam could be performed with this format.  Please refer to the patient's chart for his  consent to telehealth for [facility].    Date:  05/13/2020   ID:  George Fitzgerald, DOB 10/31/1950, MRN [id number] The patient was identified using 2 identifiers.  Patient Location: Home Provider Location: Home Office  PCP:  Plotnikov, Evie Lacks, MD  Cardiologist:  No primary care provider on file.  Electrophysiologist:  None   Evaluation Performed:  Follow-Up Visit  Chief Complaint:  Coronary artery calcifications, HTN, HLD  History of Present Illness:    George Fitzgerald is a 69 y.o. male with coronary artery calcifications and hyperlipidemia who presents for follow up of blood pressure.   BP has overall been better controlled. We reviewed BP logs together, slightly higher readings are in the middle of the day when he returns from the gym. Otherwise readings well controlled and at goal with losartan 25 mg daily. Labs reviews. Mildly elevated potassium, we discussed hydration and dietary potassium sources. Has historically be mildly elevated predating losartan.   He has prior left leg vein stripping for venous varicosities. Left leg will feel tight with activity, but not consistently. Does not seem typical for claudication by  description. He will observe and let us know if he wants LE doppler scan for arterial or venous disease. He describes it as a sort of cramp. We discussed vascular disease and possibility of PAD.  No CP or SOB. Exercising vigorously without issue.   The patient does not have symptoms concerning for COVID-19 infection (fever, chills, cough, or new shortness of breath).    Past Medical History:  Diagnosis Date  . Arthritis   . Ganglion cyst    L wrist, R foot  . GERD (gastroesophageal reflux disease)   . Gout   . Hyperlipidemia   . Hypothyroidism 05/06/2015  . 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 Varicose veins    L   Past Surgical History:  Procedure Laterality Date  . COLONOSCOPY    . MASS EXCISION  07/28/2011   Procedure: EXCISION MASS/ left foot;  Surgeon: Wylene Simmer, MD;  Location: [facility];  Service: Orthopedics;  Laterality: Left;  left foot ganglion cyst excision  . VARICOSE VEIN SURGERY  2011   left leg     Current Meds  Medication Sig  . allopurinol (ZYLOPRIM) 300 MG tablet Take 1 tablet (300 mg total) by mouth daily.  Marland Kitchen aspirin EC 81 MG tablet Take 81 mg by mouth daily. Swallow whole.  Marland Kitchen atorvastatin (LIPITOR) 40 MG tablet TAKE 1 TABLET(40 MG) BY MOUTH DAILY  . Cholecalciferol (VITAMIN D3) 25 MCG (1000 UT) CAPS Take 3,000 Units by mouth daily.   . colchicine 0.6 MG tablet Take 1 tablet (0.6 mg total) by mouth 4 (four) times daily as needed.  . Cyanocobalamin (VITAMIN B-12) 1000 MCG SUBL  Place 1 tablet (1,000 mcg total) under the tongue daily.  . indomethacin (INDOCIN) 50 MG capsule Take 1 capsule (50 mg total) by mouth 3 (three) times daily as needed.  Marland Kitchen losartan (COZAAR) 25 MG tablet Take 1 tablet (25 mg total) by mouth daily.  Marland Kitchen omeprazole (PRILOSEC) 20 MG capsule Take 20 mg by mouth daily as needed.      Allergies:   Patient has no known allergies.   Social History   Tobacco Use  . Smoking status: Never Smoker  . Smokeless tobacco:  Never Used  Substance Use Topics  . Alcohol use: Yes    Alcohol/week: 14.0 standard drinks    Types: 14 Glasses of wine per week  . Drug use: No     Family Hx: The patient's family history includes Kidney disease (age of onset: 21) in his father; Nephritis in his father; Stroke in his mother.  ROS:   Please see the history of present illness.     All other systems reviewed and are negative.   Prior CV studies:   The following studies were reviewed today:    Labs/Other Tests and Data Reviewed:    EKG: no ecg reviewed  Recent Labs: 01/01/2020: ALT 24; Hemoglobin 14.4; Platelets 179.0; TSH 5.74 05/11/2020: BUN 18; Creatinine, Ser 0.89; Potassium 5.4; Sodium 140   Recent Lipid Panel Lab Results  Component Value Date/Time   CHOL 183 05/11/2020 12:19 PM   TRIG 40 05/11/2020 12:19 PM   HDL 128 05/11/2020 12:19 PM   CHOLHDL 1.4 05/11/2020 12:19 PM   CHOLHDL 3 01/01/2020 08:47 AM   LDLCALC 46 05/11/2020 12:19 PM   LDLDIRECT 131.6 03/15/2012 07:54 AM    Wt Readings from Last 3 Encounters:  05/13/20 178 lb (80.7 kg)  04/14/20 174 lb 12.8 oz (79.3 kg)  03/11/20 176 lb (79.8 kg)     Objective:    Vital Signs:  BP 109/62   Pulse 80   Ht 6\' 2"  (1.88 m)   Wt 178 lb (80.7 kg)   BMI 22.85 kg/m    VITAL SIGNS:  reviewed GEN:  no acute distress RESPIRATORY:  normal respiratory effort, no increased work of breathing NEURO:  alert and oriented x 3, speech normal PSYCH:  normal affect  ASSESSMENT & PLAN:    1. Dyslipidemia   2. Elevated HDL   3. Medication management   4. Essential hypertension   5. Coronary artery calcification   6. Varicose veins of left lower extremity with inflammation    Dyslipidemia and elevated HDL - he has had an excellent response in LDL with atorvastatin 40 mg daily. His HDL was elevated before and has increased. I wonder if this is dysfunction HDL particles and with CAC, a review with our lipid specialist Dr. Debara Pickett was offered to the patient  to review if further investigation with apolipoprotein values is necessary for best risk modification. He would like to pursue review with lipid clinic. I will refer.   HTN - tolerating losartan 25 mg daily. BP overall satisfactory. Continue current dose.   CAC - on ASA 81 mg daily, atorvastatin 40 mg daily.   L leg venous varicosities and leg discomfort - will observe symptoms. Consider arterial and/or venous duplex if sx persist.   COVID-19 Education: The signs and symptoms of COVID-19 were discussed with the patient and how to seek care for testing (follow up with PCP or arrange E-visit).  The importance of social distancing was discussed today.  Time:   Today,  I have spent 25 minutes with the patient with telehealth technology discussing the above problems.     Medication Adjustments/Labs and Tests Ordered: Current medicines are reviewed at length with the patient today.  Concerns regarding medicines are outlined above.   Tests Ordered: No orders of the defined types were placed in this encounter.   Medication Changes: No orders of the defined types were placed in this encounter.   Follow Up: 3 mo virtual  Signed, Elouise Munroe, MD  05/13/2020 9:16 AM    [place]

## 2020-05-13 NOTE — Patient Instructions (Signed)
Medication Instructions:  Your Physician recommend you continue on your current medication as directed.    *If you need a refill on your cardiac medications before your next appointment, please call your pharmacy*   Lab Work: None ordered   Testing/Procedures: None Ordered   Follow-Up: At [facility], you and your health needs are our priority.  As part of our continuing mission to provide you with exceptional heart care, we have created designated Provider Care Teams.  These Care Teams include your primary Cardiologist (physician) and Advanced Practice Providers (APPs -  Physician Assistants and Nurse Practitioners) who all work together to provide you with the care you need, when you need it.  We recommend signing up for the patient portal called "MyChart".  Sign up information is provided on this After Visit Summary.  MyChart is used to connect with patients for Virtual Visits (Telemedicine).  Patients are able to view lab/test results, encounter notes, upcoming appointments, etc.  Non-urgent messages can be sent to your provider as well.   To learn more about what you can do with MyChart, go to [email or web address].    Your next appointment:   August 07, 2020 @ 9:40  The format for your next appointment:   Virtual Visit   Provider:   Cherlynn Kaiser, MD  Referral placed for lipid clinic.

## 2020-05-23 DIAGNOSIS — Z23 Encounter for immunization: Secondary | ICD-10-CM | POA: Diagnosis not present

## 2020-06-13 DIAGNOSIS — Z23 Encounter for immunization: Secondary | ICD-10-CM | POA: Diagnosis not present

## 2020-06-18 ENCOUNTER — Encounter: Payer: Self-pay | Admitting: Internal Medicine

## 2020-06-18 ENCOUNTER — Telehealth (INDEPENDENT_AMBULATORY_CARE_PROVIDER_SITE_OTHER): Payer: Medicare Other | Admitting: Internal Medicine

## 2020-06-18 VITALS — BP 109/76 | HR 82 | Temp 97.6°F | Ht 74.0 in | Wt 177.0 lb

## 2020-06-18 DIAGNOSIS — R931 Abnormal findings on diagnostic imaging of heart and coronary circulation: Secondary | ICD-10-CM | POA: Diagnosis not present

## 2020-06-18 DIAGNOSIS — I2510 Atherosclerotic heart disease of native coronary artery without angina pectoris: Secondary | ICD-10-CM | POA: Diagnosis not present

## 2020-06-18 DIAGNOSIS — R7989 Other specified abnormal findings of blood chemistry: Secondary | ICD-10-CM

## 2020-06-18 DIAGNOSIS — I10 Essential (primary) hypertension: Secondary | ICD-10-CM

## 2020-06-18 NOTE — Progress Notes (Addendum)
Virtual Visit via Video Note   This visit type was conducted due to national recommendations for restrictions regarding the COVID-19 Pandemic (e.g. social distancing) in an effort to limit this patient's exposure and mitigate transmission in our community.  Due to his co-morbid illnesses, this patient is at least at moderate risk for complications without adequate follow up.  This format is felt to be most appropriate for this patient at this time.  All issues noted in this document were discussed and addressed.  A limited physical exam was performed with this format.  Please refer to the patient's chart for his consent to telehealth for [facility].  Video Connection Lost Video connection was lost at > 50% of the duration of this visit, at which time the remainder of the visit was completed via audio only.     Date:  06/19/2020   ID:  George Fitzgerald, DOB 09-21-1950, MRN [id number] The patient was identified using 2 identifiers.  Evaluation Performed:  New Patient Evaluation  Patient Location:  [place] [address]  Provider location:   [address] [place], [place] [address]  PCP:  Cassandria Anger, MD  Cardiologist:  No primary care provider on file. Electrophysiologist:  None   Chief Complaint: Manage dyslipidemia  History of Present Illness:    George Fitzgerald is a 69 y.o. male who presents via audio/video conferencing for a telehealth visit today.  This is a pleasant 69 year old male kindly referred by Dr. Margaretann Loveless for the evaluation and management of dyslipidemia.  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  He underwent risk ratification in April 2021 with a CT calcium score which is significantly elevated at 1173, 87th percentile for age and sex matched control.  Subsequent nuclear stress testing demonstrated no ischemia.  As far as medical therapy, he has been on aspirin 81 mg,  high potency atorvastatin 40 mg daily and low-dose losartan 25 mg daily which was started around the beginning of August.  Of note, since then lab work has demonstrated some persistent hyperkalemia.  His last potassium 5 months ago was 5.7 and he has had some elevated potassium in the distant past.  Its not clear if this is related to his losartan or potentially worsened by it.  There is history of nephritis in his father however his urinalysis indicated no protein in April and his creatinine is normal.  He also has a history of gout but is on allopurinol and has not had an attack in some time.  His most recent lipid profile as of May 11, 2020, demonstrated total cholesterol 183, triglycerides 40, HDL 128 and LDL of 46.  Historically, he has had a very high HDL cholesterol however this more recently is significantly elevated.  In addition he had triglycerides as high as 150 however are significantly reduced at this point.  In the past, he was reassured because of his favorable HDL to LDL ratio, however I suspect he has dysfunctional HDL which is not uncommon with very high HDL numbers.  Other causes of high HDL include insulin therapy which she is not on, hypothyroidism (recent TSH was very minimally elevated, but not likely significant), certain medications such as phenytoin which she does not take and alcohol use.  He does report what I would call moderate alcohol use including at least one drink and several glasses of wine daily.  This certainly could be contributing to higher HDL numbers.  I believe that his standard lipid profile is not likely capturing his increased risk.  He also reports a very low sugar diet which she has worked hard on over the past several months.  The patient does not have symptoms concerning for COVID-19 infection (fever, chills, cough, or new SHORTNESS OF BREATH).    Prior CV studies:   The following studies were reviewed today:  Chart reviewed, lab work  PMHx:  Past  Medical History:  Diagnosis Date  . Arthritis   . Ganglion cyst    L wrist, R foot  . GERD (gastroesophageal reflux disease)   . Gout   . Hyperlipidemia   . Hypothyroidism 05/06/2015  . 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 Varicose veins    L    Past Surgical History:  Procedure Laterality Date  . COLONOSCOPY    . MASS EXCISION  07/28/2011   Procedure: EXCISION MASS/ left foot;  Surgeon: Wylene Simmer, MD;  Location: [facility];  Service: Orthopedics;  Laterality: Left;  left foot ganglion cyst excision  . VARICOSE VEIN SURGERY  2011   left leg    FAMHx:  Family History  Problem Relation Age of Onset  . Stroke Mother 38  . Nephritis Father 63  . Kidney disease Father 47       nephritis    SOCHx:   reports that he has never smoked. He has never used smokeless tobacco. He reports current alcohol use of about 14.0 standard drinks of alcohol per week. He reports that he does not use drugs.  ALLERGIES:  No Known Allergies  MEDS:  Current Meds  Medication Sig  . allopurinol (ZYLOPRIM) 300 MG tablet Take 1 tablet (300 mg total) by mouth daily.  Marland Kitchen aspirin EC 81 MG tablet Take 81 mg by mouth daily. Swallow whole.  Marland Kitchen atorvastatin (LIPITOR) 40 MG tablet TAKE 1 TABLET(40 MG) BY MOUTH DAILY  . Cholecalciferol (VITAMIN D3) 25 MCG (1000 UT) CAPS Take 3,000 Units by mouth daily.   . colchicine 0.6 MG tablet Take 1 tablet (0.6 mg total) by mouth 4 (four) times daily as needed.  . Cyanocobalamin (VITAMIN B-12) 1000 MCG SUBL Place 1 tablet (1,000 mcg total) under the tongue daily.  . indomethacin (INDOCIN) 50 MG capsule Take 1 capsule (50 mg total) by mouth 3 (three) times daily as needed.  Marland Kitchen losartan (COZAAR) 25 MG tablet Take 1 tablet (25 mg total) by mouth daily.  Marland Kitchen omeprazole (PRILOSEC) 20 MG capsule Take 20 mg by mouth daily as needed.      ROS: Pertinent items noted in HPI and remainder of comprehensive ROS otherwise negative.  Labs/Other Tests and Data  Reviewed:    Recent Labs: 01/01/2020: ALT 24; Hemoglobin 14.4; Platelets 179.0; TSH 5.74 05/11/2020: BUN 18; Creatinine, Ser 0.89; Potassium 5.4; Sodium 140   Recent Lipid Panel Lab Results  Component Value Date/Time   CHOL 183 05/11/2020 12:19 PM   TRIG 40 05/11/2020 12:19 PM   HDL 128 05/11/2020 12:19 PM   CHOLHDL 1.4 05/11/2020 12:19 PM   CHOLHDL 3 01/01/2020 08:47 AM   LDLCALC 46 05/11/2020 12:19 PM   LDLDIRECT 131.6 03/15/2012 07:54 AM    Wt Readings from Last 3 Encounters:  06/18/20 177 lb (80.3 kg)  05/13/20 178 lb (80.7 kg)  04/14/20 174 lb 12.8 oz (79.3 kg)     Exam:    Vital Signs:  BP 109/76   Pulse 82   Temp 97.6 F (36.4 C)   Ht 6'  2" (1.88 m)   Wt 177 lb (80.3 kg)   BMI 22.73 kg/m    Exam deferred due to telephone visit  ASSESSMENT & PLAN:    1. Mixed dyslipidemia with high HDL, low triglycerides and low LDL cholesterol 2. High risk coronary calcification with CAC score 1173, 87th percentile 3. Moderate alcohol use 4. Hyperkalemia  Mr. Crissman has a mixed dyslipidemia with high probably dysfunctional HDL.  Recently commercial testing is becoming available for testing the functionality of surface ApoA1, APO C3 and other functional proteins, however the utility of this information would not likely change management.  I agree with high potency statin and I am interested to see what his particle numbers would be and more specifically his APO B levels.  His LDL is most likely falsely lower and I suspect he has high numbers of larger HDL cholesterol particles which would suggest that they are dysfunctional.  Based on this further information I could further adjust his risk and treatment.  Additionally, moderate alcohol use may be contributing to higher HDL cholesterol and I have advised backing off to no more than 1 drink per day.  He seems to think that he would be able to do this.  Finally, he was concerned about recurrent hyperkalemia.  This was an issue in the past  and more recently has been an issue on losartan which was started in August.  He may not be able to tolerate the losartan especially since it sounds like he has few sources of potassium in his diet.  Advised him to discuss this further with his cardiologist as he may need to consider a different agent.  Thanks for this interesting referral.  COVID-19 Education: The signs and symptoms of COVID-19 were discussed with the patient and how to seek care for testing (follow up with PCP or arrange E-visit).  The importance of social distancing was discussed today.  Patient Risk:   After full review of this patients clinical status, I feel that they are at least moderate risk at this time.  Time:   Today, I have spent 25 minutes with the patient with telehealth technology discussing HDL theory, particle testing, coronary artery calcification.     Medication Adjustments/Labs and Tests Ordered: Current medicines are reviewed at length with the patient today.  Concerns regarding medicines are outlined above.   Tests Ordered: Orders Placed This Encounter  Procedures  . Lipoprotein A (LPA)  . NMR, lipoprofile  . Apolipoprotein B    Medication Changes: No orders of the defined types were placed in this encounter.   Disposition:  in 3 month(s)  Pixie Casino, MD, [facility], [place] Director of the Advanced Lipid Disorders &  Cardiovascular Risk Reduction Clinic Diplomate of the American Board of Clinical Lipidology Attending Cardiologist  Direct Dial: [phone]  Fax: [phone]  Website:  [email or web address]  Pixie Casino, MD  06/19/2020 8:43 AM

## 2020-06-18 NOTE — Patient Instructions (Signed)
Medication Instructions:  Your physician recommends that you continue on your current medications as directed. Please refer to the Current Medication list given to you today.  *If you need a refill on your cardiac medications before your next appointment, please call your pharmacy*   Lab Work: FASTING lab work - lipid NMR, LP(a), apolipoprotein B  If you have labs (blood work) drawn today and your tests are completely normal, you will receive your results only by: Marland Kitchen MyChart Message (if you have MyChart) OR . A paper copy in the mail If you have any lab test that is abnormal or we need to change your treatment, we will call you to review the results.  Follow-Up: At [facility], you and your health needs are our priority.  As part of our continuing mission to provide you with exceptional heart care, we have created designated Provider Care Teams.  These Care Teams include your primary Cardiologist (physician) and Advanced Practice Providers (APPs -  Physician Assistants and Nurse Practitioners) who all work together to provide you with the care you need, when you need it.  We recommend signing up for the patient portal called "MyChart".  Sign up information is provided on this After Visit Summary.  MyChart is used to connect with patients for Virtual Visits (Telemedicine).  Patients are able to view lab/test results, encounter notes, upcoming appointments, etc.  Non-urgent messages can be sent to your provider as well.   To learn more about what you can do with MyChart, go to [email or web address].    Your next appointment:   3-4 month(s)  The format for your next appointment:   In Person or Virtual  Provider:   Raliegh Ip Mali Hilty, MD   Other Instructions

## 2020-06-19 ENCOUNTER — Encounter: Payer: Self-pay | Admitting: Internal Medicine

## 2020-06-19 DIAGNOSIS — E785 Hyperlipidemia, unspecified: Secondary | ICD-10-CM | POA: Diagnosis not present

## 2020-06-20 LAB — NMR, LIPOPROFILE
Cholesterol, Total: 195 mg/dL (ref 100–199)
HDL Particle Number: 46.8 umol/L (ref >=30.5)
HDL-C: 121 mg/dL (ref >39)
LDL Particle Number: <300 nmol/L (ref <1000)
LDL Size: 21.4 nm (ref >20.5)
LDL-C (NIH Calc): 64 mg/dL (ref 0–99)
LP-IR Score: 32 (ref <=45)
Small LDL Particle Number: <90 nmol/L (ref <=527)
Triglycerides: 51 mg/dL (ref 0–149)

## 2020-06-20 LAB — APOLIPOPROTEIN B: Apolipoprotein B: 54 mg/dL (ref <90)

## 2020-06-20 LAB — LIPOPROTEIN A (LPA): Lipoprotein (a): 76.1 nmol/L — ABNORMAL HIGH (ref <75.0)

## 2020-07-15 ENCOUNTER — Other Ambulatory Visit: Payer: Self-pay | Admitting: Internal Medicine

## 2020-07-15 MED ORDER — HYDROXYCHLOROQUINE SULFATE 200 MG PO TABS: ORAL_TABLET | ORAL | 0 refills | Status: DC

## 2020-08-07 ENCOUNTER — Telehealth (INDEPENDENT_AMBULATORY_CARE_PROVIDER_SITE_OTHER): Payer: Medicare Other | Admitting: Internal Medicine

## 2020-08-07 ENCOUNTER — Encounter: Payer: Self-pay | Admitting: Internal Medicine

## 2020-08-07 VITALS — BP 114/66 | HR 92 | Ht 74.0 in | Wt 174.5 lb

## 2020-08-07 DIAGNOSIS — I10 Essential (primary) hypertension: Secondary | ICD-10-CM | POA: Diagnosis not present

## 2020-08-07 DIAGNOSIS — E785 Hyperlipidemia, unspecified: Secondary | ICD-10-CM | POA: Diagnosis not present

## 2020-08-07 DIAGNOSIS — R931 Abnormal findings on diagnostic imaging of heart and coronary circulation: Secondary | ICD-10-CM | POA: Diagnosis not present

## 2020-08-07 NOTE — Patient Instructions (Signed)

## 2020-08-07 NOTE — Progress Notes (Signed)
Virtual Visit via Telephone Note   This visit type was conducted due to national recommendations for restrictions regarding the COVID-19 Pandemic (e.g. social distancing) in an effort to limit this patient's exposure and mitigate transmission in our community.  Due to his co-morbid illnesses, this patient is at least at moderate risk for complications without adequate follow up.  This format is felt to be most appropriate for this patient at this time.  The patient did not have access to video technology/had technical difficulties with video requiring transitioning to audio format only (telephone).  All issues noted in this document were discussed and addressed.  No physical exam could be performed with this format.  Please refer to the patient's chart for his  consent to telehealth for [facility].    Date:  08/07/2020   ID:  George Fitzgerald, DOB [birth year], MRN [id number] The patient was identified using 2 identifiers.  Patient Location: Home Provider Location: Home Office  PCP:  Plotnikov, Evie Lacks, MD  Cardiologist:  No primary care provider on file.  Electrophysiologist:  None   Evaluation Performed:  Follow-Up Visit  Chief Complaint:  CAC  History of Present Illness:    George Fitzgerald is a 69 y.o. male with a history of coronary artery calcifications and hyperlipidemia who presents for follow up.  Losartan working very well, takes BP in am, takes med at night. Losartan makes him drowsy.  He sometimes feels drowsy in the morning and attributes this to losartan.  He reports occasionally having blood pressures in the 80/60 or 90/60 range.  We discussed that this is too low, and if he notices this on a regular basis, we should adjust therapy.  We have tried amlodipine before which was not well tolerated due to lower extremity edema.  He continues to have dizziness which started prior to our initial consultation.  Dizziness with standing after sitting for a prolonged period of time, not  changed since starting losartan. Has varicose veins, we discussed trying compression socks when home and evaluating symptoms.  He had a visit with lipid clinic for high HDL with no change in therapy recommended.  He remains without chest pain or shortness of breath, and is exercising frequently without symptoms.   The patient does not have symptoms concerning for COVID-19 infection (fever, chills, cough, or new shortness of breath).    Past Medical History:  Diagnosis Date  . Arthritis   . Ganglion cyst    L wrist, R foot  . GERD (gastroesophageal reflux disease)   . Gout   . Hyperlipidemia   . Hypothyroidism 05/06/2015  . 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 Varicose veins    L   Past Surgical History:  Procedure Laterality Date  . COLONOSCOPY    . MASS EXCISION  07/28/2011   Procedure: EXCISION MASS/ left foot;  Surgeon: Wylene Simmer, MD;  Location: [place];  Service: Orthopedics;  Laterality: Left;  left foot ganglion cyst excision  . VARICOSE VEIN SURGERY  2011   left leg     Current Meds  Medication Sig  . allopurinol (ZYLOPRIM) 300 MG tablet Take 1 tablet (300 mg total) by mouth daily.  Marland Kitchen aspirin EC 81 MG tablet Take 81 mg by mouth daily. Swallow whole.  Marland Kitchen atorvastatin (LIPITOR) 40 MG tablet TAKE 1 TABLET(40 MG) BY MOUTH DAILY  . Cholecalciferol (VITAMIN D3) 25 MCG (1000 UT) CAPS Take 3,000 Units by mouth daily.   . colchicine 0.6 MG tablet Take 1 tablet (  0.6 mg total) by mouth 4 (four) times daily as needed.  . Cyanocobalamin (VITAMIN B-12) 1000 MCG SUBL Place 1 tablet (1,000 mcg total) under the tongue daily.  . indomethacin (INDOCIN) 50 MG capsule Take 1 capsule (50 mg total) by mouth 3 (three) times daily as needed.  Marland Kitchen omeprazole (PRILOSEC) 20 MG capsule Take 20 mg by mouth daily as needed.      Allergies:   Patient has no known allergies.   Social History   Tobacco Use  . Smoking status: Never Smoker  . Smokeless tobacco: Never  Used  Substance Use Topics  . Alcohol use: Yes    Alcohol/week: 14.0 standard drinks    Types: 14 Glasses of wine per week  . Drug use: No     Family Hx: The patient's family history includes Kidney disease (age of onset: 12) in his father; Nephritis (age of onset: 71) in his father; Stroke (age of onset: 51) in his mother.  ROS:   Please see the history of present illness.     All other systems reviewed and are negative.   Prior CV studies:   The following studies were reviewed today:    Labs/Other Tests and Data Reviewed:    EKG:  No ECG reviewed.  Recent Labs: 01/01/2020: ALT 24; Hemoglobin 14.4; Platelets 179.0; TSH 5.74 05/11/2020: BUN 18; Creatinine, Ser 0.89; Potassium 5.4; Sodium 140   Recent Lipid Panel Lab Results  Component Value Date/Time   CHOL 183 05/11/2020 12:19 PM   TRIG 40 05/11/2020 12:19 PM   HDL 128 05/11/2020 12:19 PM   CHOLHDL 1.4 05/11/2020 12:19 PM   CHOLHDL 3 01/01/2020 08:47 AM   LDLCALC 46 05/11/2020 12:19 PM   LDLDIRECT 131.6 03/15/2012 07:54 AM    Wt Readings from Last 3 Encounters:  08/07/20 174 lb 8 oz (79.2 kg)  06/18/20 177 lb (80.3 kg)  05/13/20 178 lb (80.7 kg)     Risk Assessment/Calculations:      Objective:    Vital Signs:  BP 114/66   Pulse 92   Ht 6\' 2"  (1.88 m)   Wt 174 lb 8 oz (79.2 kg)   BMI 22.40 kg/m    VITAL SIGNS:  reviewed GEN:  no acute distress RESPIRATORY:  normal respiratory effort, no increased work of breathing NEURO:  alert and oriented x 3, speech normal PSYCH:  normal affect   ASSESSMENT & PLAN:    1. Agatston coronary artery calcium score greater than 400   2. Essential hypertension   3. Dyslipidemia    Coronary artery calcifications and dyslipidemia - no symptoms. No ischemia on stress. Continue ASA and atorvastatin.  HTN - will continue losartan for now, but both mild hypotension and mild hyperkalemia may be reasons to stop and consider alternate therapy. He will log his BP and sx and  keep [place] informed.   Dizziness with standing - may be due to venous varicosities. I recommend compression socks. He will try this.   COVID-19 Education: The signs and symptoms of COVID-19 were discussed with the patient and how to seek care for testing (follow up with PCP or arrange E-visit).  The importance of social distancing was discussed today.  Time:   Today, I have spent 25 minutes with the patient with telehealth technology discussing the above problems.     Medication Adjustments/Labs and Tests Ordered: Current medicines are reviewed at length with the patient today.  Concerns regarding medicines are outlined above.   Patient Instructions  Medication Instructions:  No Changes In Medications at this time.  *If you need a refill on your cardiac medications before your next appointment, please call your pharmacy*  Follow-Up: At [facility], you and your health needs are our priority.  As part of our continuing mission to provide you with exceptional heart care, we have created designated Provider Care Teams.  These Care Teams include your primary Cardiologist (physician) and Advanced Practice Providers (APPs -  Physician Assistants and Nurse Practitioners) who all work together to provide you with the care you need, when you need it.  Your next appointment:   6 month(s)  The format for your next appointment:   In Person  Provider:   Cherlynn Kaiser, MD     Signed, Elouise Munroe, MD  08/07/2020 2:25 PM    [place]

## 2020-08-10 IMAGING — DX DG LUMBAR SPINE COMPLETE 4+V
5 series · 5 of 5 positions shown · non-contrast
Comparison: None.

CLINICAL DATA: 67-year-old male with right lower back pain for 2
months. No injury. Initial encounter.

EXAM:
LUMBAR SPINE - COMPLETE 4+ VIEW

[l-spine ap]
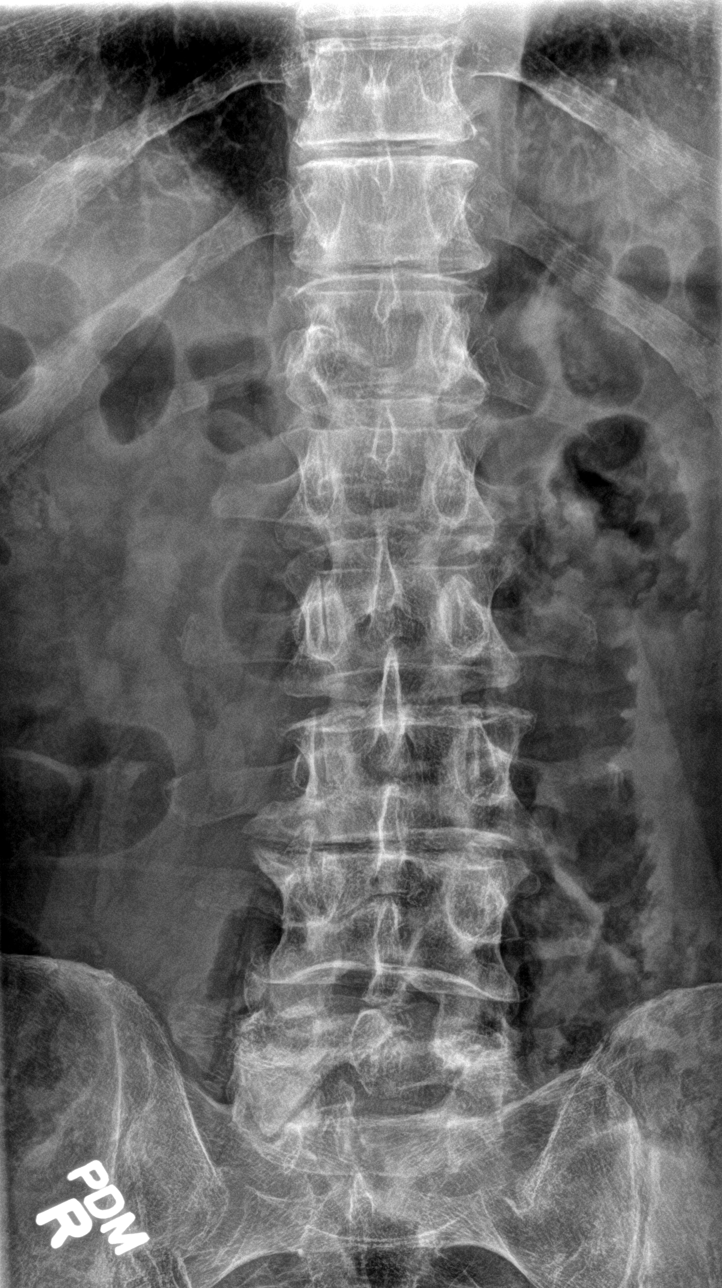

[l-spine obl (1 of 2)]
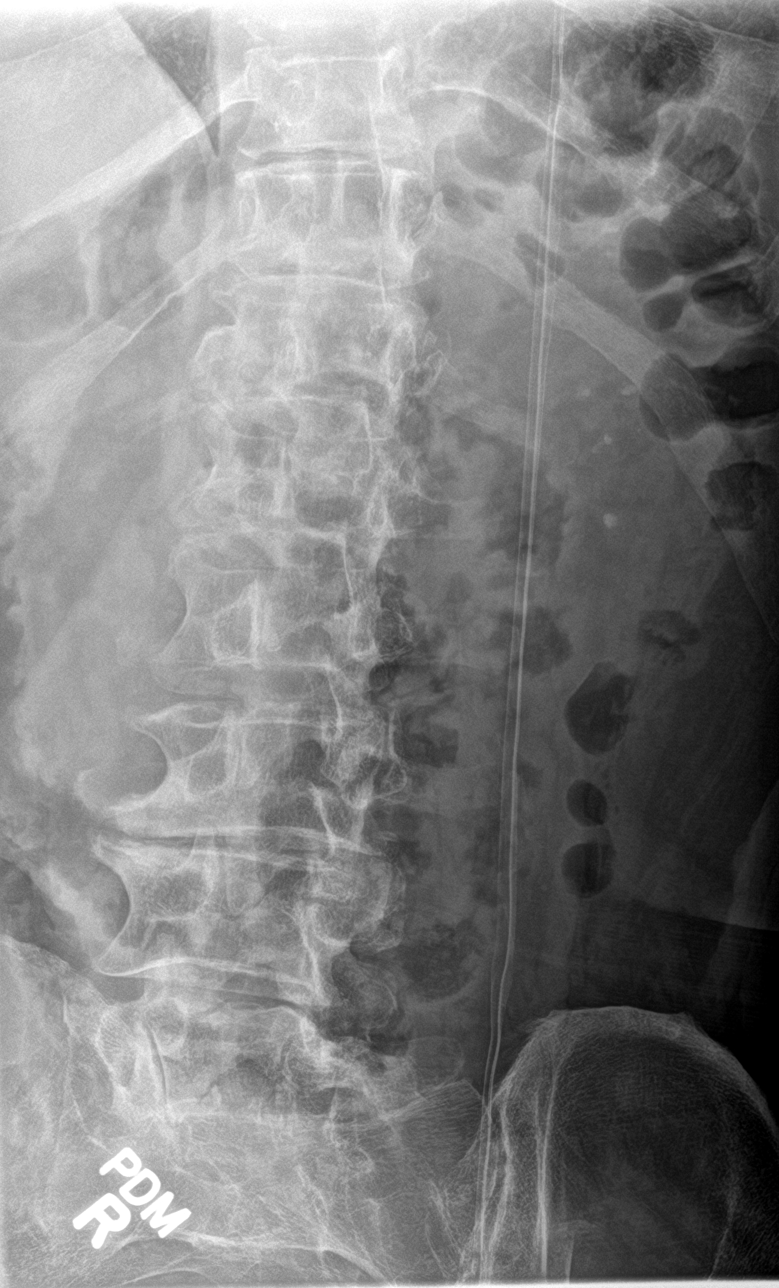

[l-spine obl (2 of 2)]
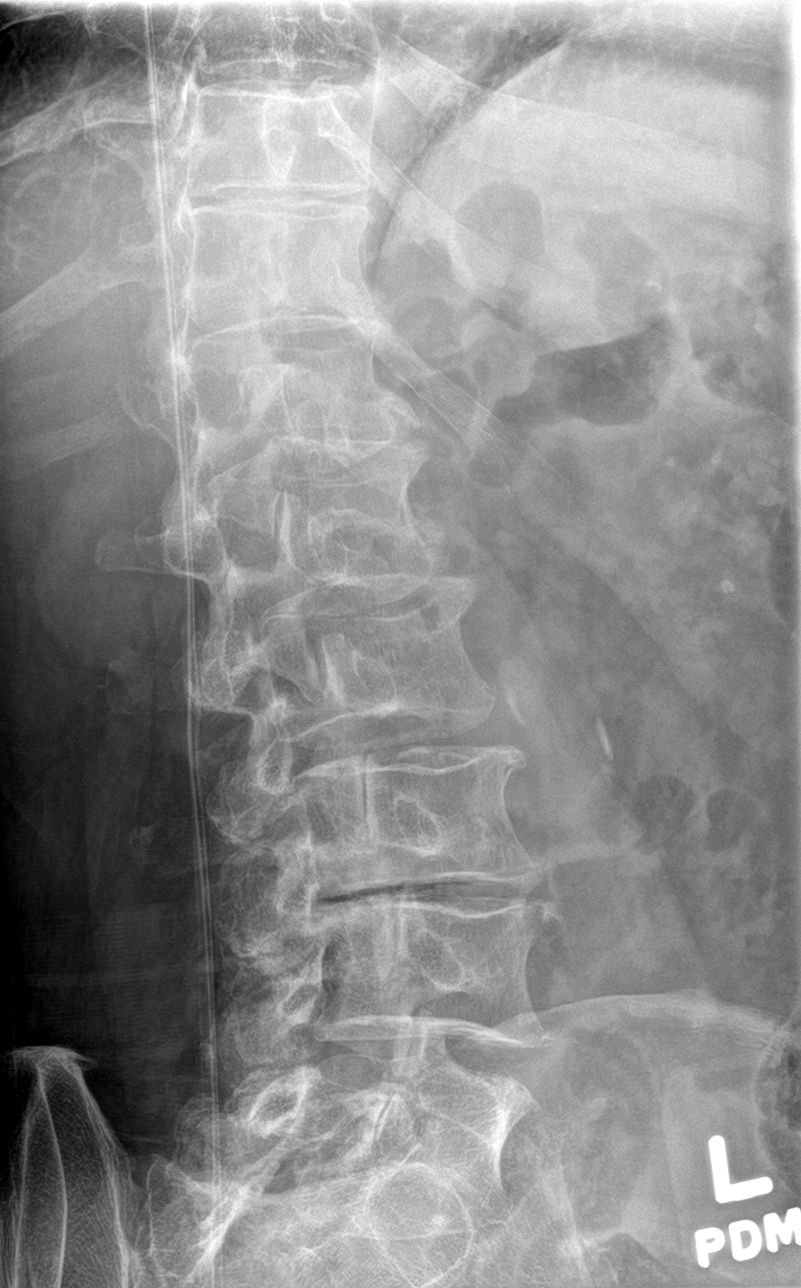

[l-spine lat]
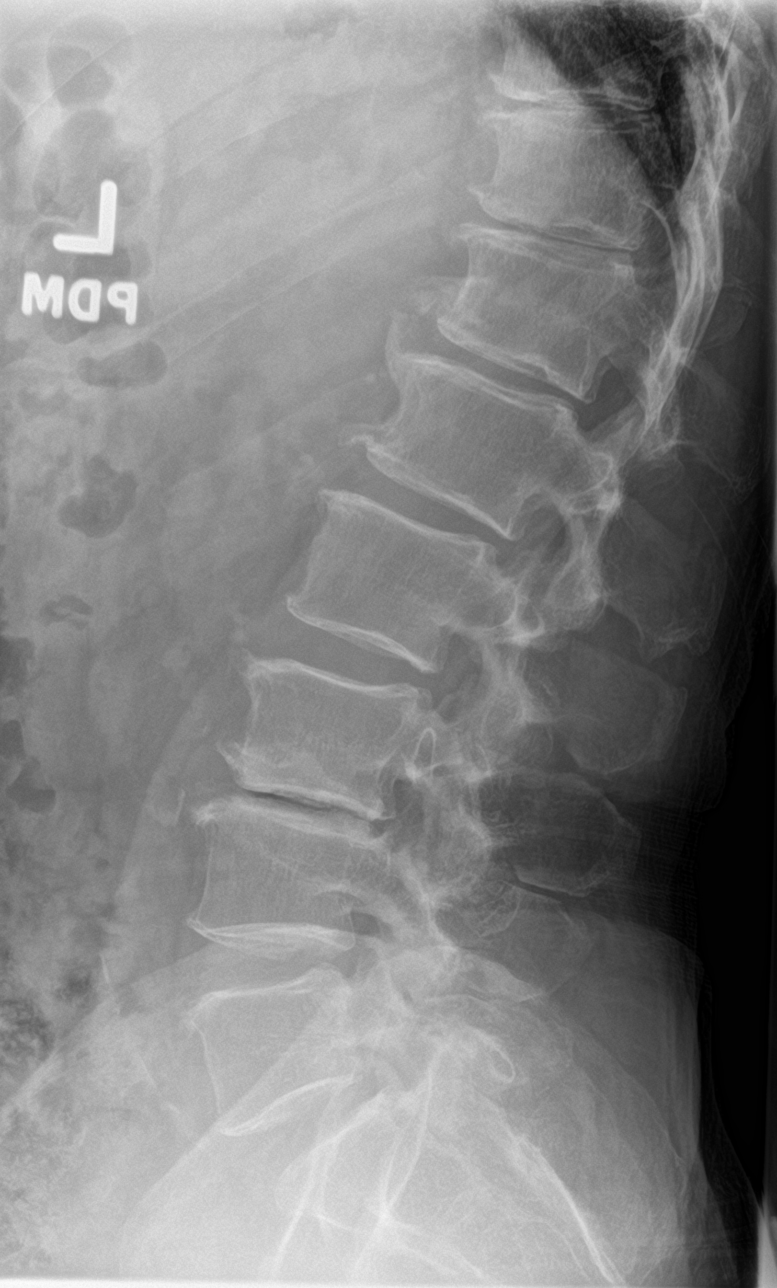

[l-spine spot]
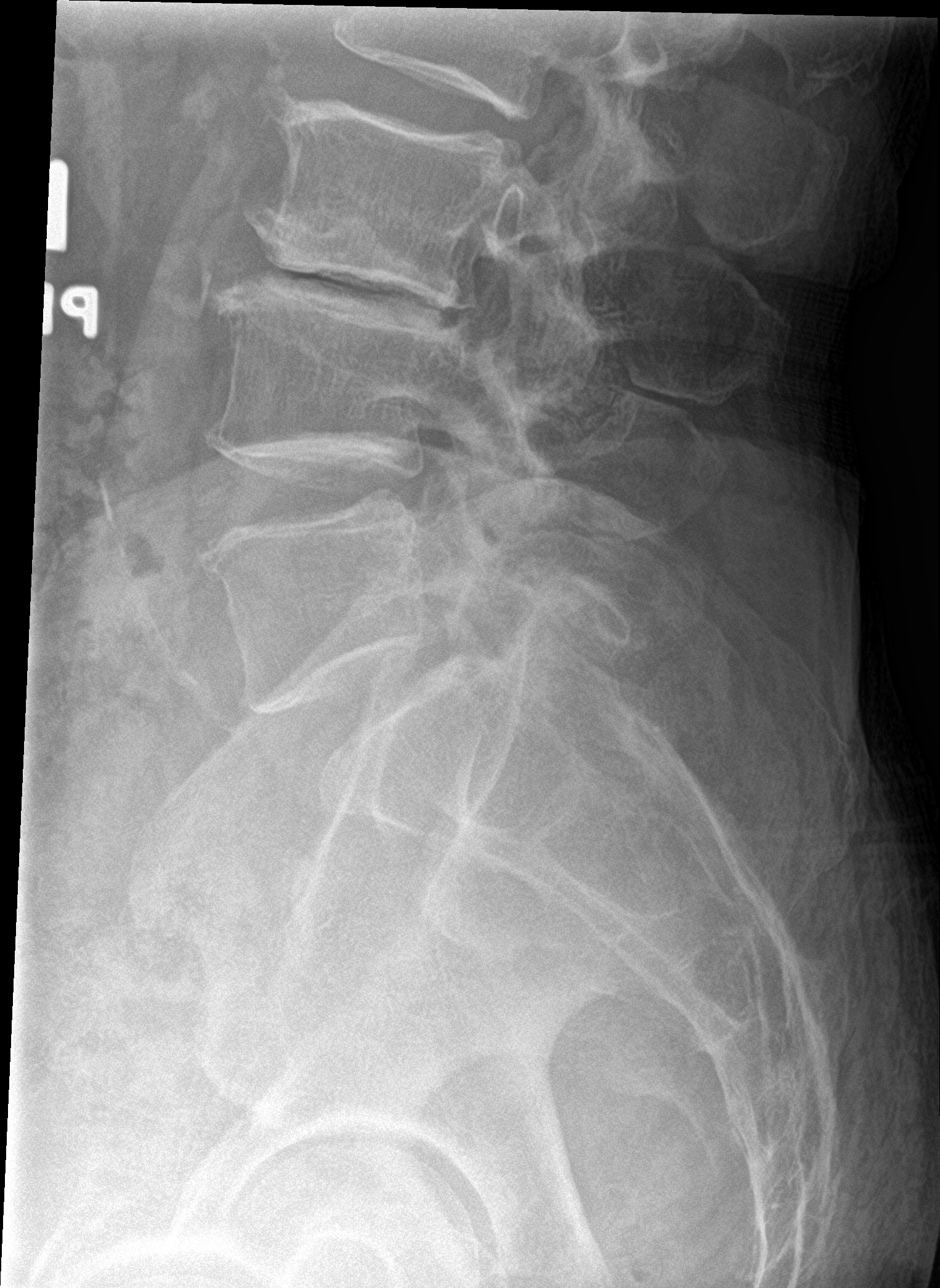

[5 of 5 positions shown; findings below may reference images not displayed]

FINDINGS: Slight curvature lumbar spine convex left. No acute compression
fracture noted.

Marked L3-4 disc space narrowing with osteophyte greater on the
right.

Left L5 pars defects suspected. Right-sided facet degenerative
changes. 2 mm anterior slip L5 without significant L5-S1 disc space
narrowing.

Mild to moderate degenerative changes T10-11 through L1-2.

Several left renal calculi measuring up to 5 mm suspected. Vascular
calcifications.
IMPRESSION: 1. Marked L3-4 disc space narrowing with osteophyte greater on the
right.
2. Left L5 pars defects suspected. Right-sided facet degenerative
changes. 2 mm anterior slip L5 without significant L5-S1 disc space
narrowing.
3. Mild to moderate degenerative changes T10-11 through L1-2.
4. Several left renal calculi measuring up to 5 mm suspected.
5.  Aortic Atherosclerosis ([id number]).

## 2020-09-22 ENCOUNTER — Other Ambulatory Visit: Payer: Self-pay | Admitting: Internal Medicine

## 2020-09-22 MED ORDER — HYDROXYCHLOROQUINE SULFATE 200 MG PO TABS: ORAL_TABLET | ORAL | 0 refills | Status: DC

## 2020-09-23 MED ORDER — HYDROXYCHLOROQUINE SULFATE 200 MG PO TABS: ORAL_TABLET | ORAL | 0 refills | Status: DC

## 2020-09-23 NOTE — Addendum Note (Signed)
Addended by: Deatra Muhamed on: 09/23/2020 12:12 PM   Modules accepted: Orders

## 2020-09-27 DIAGNOSIS — Z20822 Contact with and (suspected) exposure to covid-19: Secondary | ICD-10-CM | POA: Diagnosis not present

## 2020-09-29 ENCOUNTER — Telehealth: Payer: Medicare Other | Admitting: Internal Medicine

## 2020-10-05 ENCOUNTER — Telehealth: Payer: Self-pay | Admitting: Internal Medicine

## 2020-10-05 DIAGNOSIS — E785 Hyperlipidemia, unspecified: Secondary | ICD-10-CM

## 2020-10-05 NOTE — Telephone Encounter (Signed)
NMR lipoprofile ordered per MD Lab reminder sent to patient via MyChart

## 2020-10-07 ENCOUNTER — Other Ambulatory Visit: Payer: Self-pay

## 2020-10-07 DIAGNOSIS — E785 Hyperlipidemia, unspecified: Secondary | ICD-10-CM | POA: Diagnosis not present

## 2020-10-10 LAB — NMR, LIPOPROFILE
Cholesterol, Total: 186 mg/dL (ref 100–199)
HDL Particle Number: 43.3 umol/L (ref >=30.5)
HDL-C: 98 mg/dL (ref >39)
LDL Particle Number: 536 nmol/L (ref <1000)
LDL Size: 21.4 nm (ref >20.5)
LDL-C (NIH Calc): 74 mg/dL (ref 0–99)
LP-IR Score: 32 (ref <=45)
Small LDL Particle Number: <90 nmol/L (ref <=527)
Triglycerides: 80 mg/dL (ref 0–149)

## 2020-10-12 ENCOUNTER — Telehealth (INDEPENDENT_AMBULATORY_CARE_PROVIDER_SITE_OTHER): Payer: Medicare Other | Admitting: Internal Medicine

## 2020-10-12 ENCOUNTER — Encounter: Payer: Self-pay | Admitting: Internal Medicine

## 2020-10-12 VITALS — BP 111/65 | HR 93 | Ht 74.0 in | Wt 174.9 lb

## 2020-10-12 DIAGNOSIS — R931 Abnormal findings on diagnostic imaging of heart and coronary circulation: Secondary | ICD-10-CM

## 2020-10-12 DIAGNOSIS — E785 Hyperlipidemia, unspecified: Secondary | ICD-10-CM | POA: Diagnosis not present

## 2020-10-12 NOTE — Progress Notes (Signed)
Virtual Visit via Telephone Note   This visit type was conducted due to national recommendations for restrictions regarding the COVID-19 Pandemic (e.g. social distancing) in an effort to limit this patient's exposure and mitigate transmission in our community.  Due to his co-morbid illnesses, this patient is at least at moderate risk for complications without adequate follow up.  George Fitzgerald presents today for telephone visit. this format is felt to be most appropriate for this patient at this time.  All issues noted in this document were discussed and addressed.  Please refer to the patient's chart for his consent to telehealth for [facility].  Date:  10/12/2020   ID:  George Fitzgerald, DOB 12/21/1950, MRN [id number] The patient was identified using 2 identifiers.  Evaluation Performed:  Follow-Up Visit  Patient Location:  [place] [address]  Provider location:   [address] [place], [place] [address]  PCP:  Cassandria Anger, MD  Cardiologist:  No primary care provider on file. Electrophysiologist:  None   Chief Complaint: Manage dyslipidemia  History of Present Illness:    George Fitzgerald is a 70 y.o. male who presents via audio/video conferencing for a telehealth visit today.  This is a pleasant 70 year old male kindly referred by Dr. Margaretann Loveless for the evaluation and management of dyslipidemia.  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  He underwent risk ratification in April 2021 with a CT calcium score which is significantly elevated at 1173, 87th percentile for age and sex matched control.  Subsequent nuclear stress testing demonstrated no ischemia.  As far as medical therapy, he has been on aspirin 81 mg, high potency atorvastatin 40 mg daily and low-dose losartan 25 mg daily which was started around the beginning of August.  Of note, since then lab work has demonstrated some  persistent hyperkalemia.  His last potassium 5 months ago was 5.7 and he has had some elevated potassium in the distant past.  Its not clear if this is related to his losartan or potentially worsened by it.  There is history of nephritis in his father however his urinalysis indicated no protein in April and his creatinine is normal.  He also has a history of gout but is on allopurinol and has not had an attack in some time.  His most recent lipid profile as of May 11, 2020, demonstrated total cholesterol 183, triglycerides 40, HDL 128 and LDL of 46.  Historically, he has had a very high HDL cholesterol however this more recently is significantly elevated.  In addition he had triglycerides as high as 150 however are significantly reduced at this point.  In the past, he was reassured because of his favorable HDL to LDL ratio, however I suspect he has dysfunctional HDL which is not uncommon with very high HDL numbers.  Other causes of high HDL include insulin therapy which she is not on, hypothyroidism (recent TSH was very minimally elevated, but not likely significant), certain medications such as phenytoin which she does not take and alcohol use.  He does report what I would call moderate alcohol use including at least one drink and several glasses of wine daily.  This certainly could be contributing to higher HDL numbers.  I believe that his standard lipid profile is not likely capturing his increased risk.  He also reports a very low sugar diet which she has worked hard on over the past several months.  10/12/2020  George Fitzgerald was seen today for telephone follow-up.  Lipoprotein NMR which shows some increase in his cholesterol, namely LDL particle numbers now at 536 which is still very low, LDL-C has come up from 64-74.  HDL-C is low at 98 from 121.  His small LDL particle number is still very low below the limit of detection.  Triglycerides are 80.  Overall continues to be a favorable lipid profile.  He is on  40 mg atorvastatin.  He exercises 4 or 5 days a week for 3 to 4 hours which is quite extensive.  He has had some concerns about dizziness and asked about whether some of his medicines could be causing it such as allopurinol.  He takes his losartan at night.  He also takes hydroxychloroquine intermittently when he travels as a "preventative" for COVID-19, although I reiterated there is no credible peer-reviewed data to support this.   The patient does not have symptoms concerning for COVID-19 infection (fever, chills, cough, or new SHORTNESS OF BREATH).    Prior CV studies:   The following studies were reviewed today:  Chart reviewed, lab work  PMHx:  Past Medical History:  Diagnosis Date  . Arthritis   . Ganglion cyst    L wrist, R foot  . GERD (gastroesophageal reflux disease)   . Gout   . Hyperlipidemia   . Hypothyroidism 05/06/2015  . 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 Varicose veins    L    Past Surgical History:  Procedure Laterality Date  . COLONOSCOPY    . MASS EXCISION  07/28/2011   Procedure: EXCISION MASS/ left foot;  Surgeon: Wylene Simmer, MD;  Location: [place];  Service: Orthopedics;  Laterality: Left;  left foot ganglion cyst excision  . VARICOSE VEIN SURGERY  2011   left leg    FAMHx:  Family History  Problem Relation Age of Onset  . Stroke Mother 22  . Nephritis Father 59  . Kidney disease Father 71       nephritis    SOCHx:   reports that he has never smoked. He has never used smokeless tobacco. He reports current alcohol use of about 14.0 standard drinks of alcohol per week. He reports that he does not use drugs.  ALLERGIES:  No Known Allergies  MEDS:  Current Meds  Medication Sig  . allopurinol (ZYLOPRIM) 300 MG tablet Take 1 tablet (300 mg total) by mouth daily.  . Ascorbic Acid (SUPER C COMPLEX PO) Take 2 tablets by mouth daily.  Marland Kitchen aspirin EC 81 MG tablet Take 81 mg by mouth daily. Swallow whole.  Marland Kitchen atorvastatin  (LIPITOR) 40 MG tablet TAKE 1 TABLET(40 MG) BY MOUTH DAILY  . B Complex-C (SUPER B COMPLEX PO) Take 2 tablets by mouth daily.  . Cholecalciferol (VITAMIN D3) 25 MCG (1000 UT) CAPS Take 3,000 Units by mouth daily.  . colchicine 0.6 MG tablet Take 1 tablet (0.6 mg total) by mouth 4 (four) times daily as needed.  . Cyanocobalamin (VITAMIN B-12) 1000 MCG SUBL Place 1 tablet (1,000 mcg total) under the tongue daily.  . hydroxychloroquine (PLAQUENIL) 200 MG tablet Take 200 mg by mouth 2 (two) times daily. Use as needed when traveling  . indomethacin (INDOCIN) 50 MG capsule Take 1 capsule (50 mg total) by mouth 3 (three) times daily as needed.  Marland Kitchen omeprazole (PRILOSEC) 20 MG capsule Take 20 mg by mouth daily as needed.   . vitamin E 180 MG (400 UNITS) capsule Take 800 Units  by mouth daily.     ROS: Pertinent items noted in HPI and remainder of comprehensive ROS otherwise negative.  Labs/Other Tests and Data Reviewed:    Recent Labs: 01/01/2020: ALT 24; Hemoglobin 14.4; Platelets 179.0; TSH 5.74 05/11/2020: BUN 18; Creatinine, Ser 0.89; Potassium 5.4; Sodium 140   Recent Lipid Panel Lab Results  Component Value Date/Time   CHOL 183 05/11/2020 12:19 PM   TRIG 40 05/11/2020 12:19 PM   HDL 128 05/11/2020 12:19 PM   CHOLHDL 1.4 05/11/2020 12:19 PM   CHOLHDL 3 01/01/2020 08:47 AM   LDLCALC 46 05/11/2020 12:19 PM   LDLDIRECT 131.6 03/15/2012 07:54 AM    Wt Readings from Last 3 Encounters:  10/12/20 174 lb 14.4 oz (79.3 kg)  08/07/20 174 lb 8 oz (79.2 kg)  06/18/20 177 lb (80.3 kg)     Exam:    Vital Signs:  BP 111/65   Pulse 93   Ht 6\' 2"  (1.88 m)   Wt 174 lb 14.4 oz (79.3 kg)   BMI 22.46 kg/m    Exam deferred due to telephone visit  ASSESSMENT & PLAN:    1. Mixed dyslipidemia with high HDL, low triglycerides and low LDL cholesterol 2. High risk coronary calcification with CAC score 1173, 87th percentile 3. Moderate alcohol use 4. Hyperkalemia  George Fitzgerald continues to have a  favorable lipid profile.  He struggles with occasional dizziness.  I doubt this is related to medications however it could be listed as a side effect of allopurinol.  He may wish to consider reaching out to his PCP for an alternative such as Uloric.  He could also consider splitting the losartan in half and taking it twice daily.  Overall his lipids remain favorable.  I would continue his atorvastatin since he is comfortable with this and does have evidence of coronary calcification.  No further recommendations at this time.  He can follow-up with me as needed.  COVID-19 Education: The signs and symptoms of COVID-19 were discussed with the patient and how to seek care for testing (follow up with PCP or arrange E-visit).  The importance of social distancing was discussed today.  Patient Risk:   After full review of this patients clinical status, I feel that they are at least moderate risk at this time.  Time:   Today, I have spent 25 minutes with the patient with telehealth technology discussing HDL theory, particle testing, coronary artery calcification.     Medication Adjustments/Labs and Tests Ordered: Current medicines are reviewed at length with the patient today.  Concerns regarding medicines are outlined above.   Tests Ordered: No orders of the defined types were placed in this encounter.   Medication Changes: No orders of the defined types were placed in this encounter.   Disposition:  prn  Pixie Casino, MD, FACC, [place] Director of the Advanced Lipid Disorders &  Cardiovascular Risk Reduction Clinic Diplomate of the American Board of Clinical Lipidology Attending Cardiologist  Direct Dial: [phone]  Fax: [phone]  Website:  [email or web address]  Pixie Casino, MD  10/12/2020 8:04 AM

## 2020-10-12 NOTE — Patient Instructions (Signed)
Medication Instructions:  Your physician recommends that you continue on your current medications as directed. Please refer to the Current Medication list given to you today.  *If you need a refill on your cardiac medications before your next appointment, please call your pharmacy*  Follow-Up: At [facility], you and your health needs are our priority.  As part of our continuing mission to provide you with exceptional heart care, we have created designated Provider Care Teams.  These Care Teams include your primary Cardiologist (physician) and Advanced Practice Providers (APPs -  Physician Assistants and Nurse Practitioners) who all work together to provide you with the care you need, when you need it.  We recommend signing up for the patient portal called "MyChart".  Sign up information is provided on this After Visit Summary.  MyChart is used to connect with patients for Virtual Visits (Telemedicine).  Patients are able to view lab/test results, encounter notes, upcoming appointments, etc.  Non-urgent messages can be sent to your provider as well.   To learn more about what you can do with MyChart, go to [email or web address].    Follow up AS NEEDED with Dr. Debara Pickett for lipid management

## 2020-12-23 ENCOUNTER — Telehealth: Payer: Self-pay | Admitting: Internal Medicine

## 2020-12-23 NOTE — Telephone Encounter (Signed)
LVM for pt to rtn my call to schedule AWV with [facility]. Please schedule AWV if pt calls the office  

## 2021-01-04 ENCOUNTER — Encounter: Payer: Self-pay | Admitting: Internal Medicine

## 2021-01-04 ENCOUNTER — Other Ambulatory Visit: Payer: Self-pay

## 2021-01-04 ENCOUNTER — Ambulatory Visit (INDEPENDENT_AMBULATORY_CARE_PROVIDER_SITE_OTHER): Payer: Medicare Other

## 2021-01-04 ENCOUNTER — Ambulatory Visit (INDEPENDENT_AMBULATORY_CARE_PROVIDER_SITE_OTHER): Payer: Medicare Other | Admitting: Internal Medicine

## 2021-01-04 DIAGNOSIS — R0789 Other chest pain: Secondary | ICD-10-CM

## 2021-01-04 DIAGNOSIS — R55 Syncope and collapse: Secondary | ICD-10-CM | POA: Insufficient documentation

## 2021-01-04 DIAGNOSIS — R0781 Pleurodynia: Secondary | ICD-10-CM | POA: Diagnosis not present

## 2021-01-04 MED ORDER — TRAMADOL HCL 50 MG PO TABS: 50.0000 mg | ORAL_TABLET | Freq: Four times a day (QID) | ORAL | 1 refills | Status: AC | PRN

## 2021-01-04 NOTE — Progress Notes (Signed)
Subjective:  Patient ID: George Fitzgerald, male    DOB: Jun 19, 1951  Age: 70 y.o. MRN: [id number]  CC: Back Pain (Pt states he fell Sat night.. he states when he gets up he feel dizziness, and when he got up he lost his balance and fell. He ? If he injured a rib on (R) side)   HPI George Fitzgerald presents for passing out in the kitchen on Sat at 2 am (she fell asleep in a chair watching Netflix) stood up and walked to the kitchen to have a drink. He has had syncope before and his cardiologist is aware of. No LOC, headache, nausea or vomiting, double vision. C/o R post CP worse with movement.  No cough or shortness of breath.  Outpatient Medications Prior to Visit  Medication Sig Dispense Refill  . allopurinol (ZYLOPRIM) 300 MG tablet Take 1 tablet (300 mg total) by mouth daily. 90 tablet 3  . Ascorbic Acid (SUPER C COMPLEX PO) Take 2 tablets by mouth daily.    Marland Kitchen aspirin EC 81 MG tablet Take 81 mg by mouth daily. Swallow whole.    Marland Kitchen atorvastatin (LIPITOR) 40 MG tablet TAKE 1 TABLET(40 MG) BY MOUTH DAILY 90 tablet 3  . B Complex-C (SUPER B COMPLEX PO) Take 2 tablets by mouth daily.    . Cholecalciferol (VITAMIN D3) 25 MCG (1000 UT) CAPS Take 3,000 Units by mouth daily.    . colchicine 0.6 MG tablet Take 1 tablet (0.6 mg total) by mouth 4 (four) times daily as needed. 30 tablet 5  . Cyanocobalamin (VITAMIN B-12) 1000 MCG SUBL Place 1 tablet (1,000 mcg total) under the tongue daily. 100 tablet 3  . hydroxychloroquine (PLAQUENIL) 200 MG tablet Take 200 mg by mouth 2 (two) times daily. Use as needed when traveling    . indomethacin (INDOCIN) 50 MG capsule Take 1 capsule (50 mg total) by mouth 3 (three) times daily as needed. 60 capsule 3  . omeprazole (PRILOSEC) 20 MG capsule Take 20 mg by mouth daily as needed.     . vitamin E 180 MG (400 UNITS) capsule Take 800 Units by mouth daily.    Marland Kitchen losartan (COZAAR) 25 MG tablet Take 1 tablet (25 mg total) by mouth daily. 90 tablet 3   No facility-administered  medications prior to visit.    ROS: Review of Systems  Cardiovascular: Positive for chest pain.  Musculoskeletal: Positive for back pain. Negative for neck pain and neck stiffness.  Neurological: Positive for syncope. Negative for dizziness, speech difficulty, weakness, light-headedness and numbness.    Objective:  BP (!) 168/90 (BP Location: Left Arm)   Pulse 77   Temp 98.8 F (37.1 C) (Oral)   Ht 6\' 2"  (1.88 m)   Wt 186 lb 9.6 oz (84.6 kg)   BMI 23.96 kg/m   BP Readings from Last 3 Encounters:  01/04/21 (!) 168/90  10/12/20 111/65  08/07/20 114/66    Wt Readings from Last 3 Encounters:  01/04/21 186 lb 9.6 oz (84.6 kg)  10/12/20 174 lb 14.4 oz (79.3 kg)  08/07/20 174 lb 8 oz (79.2 kg)    Physical Exam Constitutional:      General: He is not in acute distress.    Appearance: He is well-developed.     Comments: NAD  Eyes:     Conjunctiva/sclera: Conjunctivae normal.     Pupils: Pupils are equal, round, and reactive to light.  Neck:     Thyroid: No thyromegaly.     Vascular: No  JVD.  Cardiovascular:     Rate and Rhythm: Normal rate and regular rhythm.     Heart sounds: Normal heart sounds. No murmur heard. No friction rub. No gallop.   Pulmonary:     Effort: Pulmonary effort is normal. No respiratory distress.     Breath sounds: Normal breath sounds. No wheezing or rales.  Chest:     Chest wall: No tenderness.  Abdominal:     General: Bowel sounds are normal. There is no distension.     Palpations: Abdomen is soft. There is no mass.     Tenderness: There is no abdominal tenderness. There is no guarding or rebound.  Musculoskeletal:        General: Tenderness present. Normal range of motion.     Cervical back: Normal range of motion.  Lymphadenopathy:     Cervical: No cervical adenopathy.  Skin:    General: Skin is warm and dry.     Findings: No rash.  Neurological:     Mental Status: He is alert and oriented to person, place, and time.     Cranial  Nerves: No cranial nerve deficit.     Motor: No abnormal muscle tone.     Coordination: Coordination normal.     Gait: Gait normal.     Deep Tendon Reflexes: Reflexes are normal and symmetric.  Psychiatric:        Behavior: Behavior normal.        Thought Content: Thought content normal.        Judgment: Judgment normal.   Right posterior lower ribs are tender to palpation, pain with range of motion.  No bruising  Lab Results  Component Value Date   WBC 5.1 01/01/2020   HGB 14.4 01/01/2020   HCT 42.0 01/01/2020   PLT 179.0 01/01/2020   GLUCOSE 88 05/11/2020   CHOL 183 05/11/2020   TRIG 40 05/11/2020   HDL 128 05/11/2020   LDLDIRECT 131.6 03/15/2012   LDLCALC 46 05/11/2020   ALT 24 01/01/2020   AST 27 01/01/2020   NA 140 05/11/2020   K 5.4 (H) 05/11/2020   CL 101 05/11/2020   CREATININE 0.89 05/11/2020   BUN 18 05/11/2020   CO2 25 05/11/2020   TSH 5.74 (H) 01/01/2020   PSA 0.91 01/01/2020   HGBA1C 5.6 08/28/2018    CT CARDIAC SCORING  Addendum Date: 01/10/2020   ADDENDUM REPORT: 01/10/2020 11:22 CLINICAL DATA:  Risk stratification, CAD monitoring, dyslipidemia EXAM: Coronary Calcium Score TECHNIQUE: The patient was scanned on a [employer] scanner. Axial non-contrast 3 mm slices were carried out through the heart. The data set was analyzed on a dedicated work station and scored using the [place]. FINDINGS: Non-cardiac: See separate report from [facility]. Ascending Aorta: 37 mm at mid ascending aorta measured in an axial plane. Pericardium: Normal, trace pericardial fluid. Coronary arteries: Normal coronary origins. IMPRESSION: Coronary calcium score of 1173. This was 87th percentile for age and sex matched control. Electronically Signed   By: Cherlynn Kaiser   On: 01/10/2020 11:22   Result Date: 01/10/2020 EXAM: OVER-READ INTERPRETATION  CT CHEST The following report is an over-read performed by radiologist Dr. Vinnie Langton of [facility], [place]  on 01/09/2020. This over-read does not include interpretation of cardiac or coronary anatomy or pathology. The coronary calcium score interpretation by the cardiologist is attached. COMPARISON:  None. FINDINGS: Within the visualized portions of the thorax there are no suspicious appearing pulmonary nodules or masses, there is no acute consolidative  airspace disease, no pleural effusions, no pneumothorax and no lymphadenopathy. Visualized portions of the upper abdomen demonstrates mild diffuse low attenuation throughout the visualized hepatic parenchyma. There are no aggressive appearing lytic or blastic lesions noted in the visualized portions of the skeleton. IMPRESSION: 1. Mild diffuse hepatic steatosis. Electronically Signed: By: Vinnie Langton M.D. On: 01/09/2020 16:35    Assessment & Plan:   There are no diagnoses linked to this encounter.   No orders of the defined types were placed in this encounter.    Follow-up: No follow-ups on file.  Walker Kehr, MD

## 2021-01-04 NOTE — Assessment & Plan Note (Addendum)
Rib X ray to rule out rib fracture Use tramadol prn prescribed Get a rib belt to use as needed Ice

## 2021-01-05 NOTE — Assessment & Plan Note (Signed)
Recurrent, likely vasovagal.  Not new Will watch.  Hydrate well

## 2021-01-06 NOTE — Telephone Encounter (Signed)
Called patient, patient states that he was sitting down watching netflix for a couple of hours, woke up and went to stand, got very dizzy upon standing and passed out and fell in the floor. Patient states that he has injured his ribs- patient states that he saw his PCP in regard to this and got Xray's that were okay and showed no fracture. Patient states that this dizziness happens intermittently upon standing up or moving suddenly.   Patient reports his blood pressures have been running 110/70-125/90 in the morning and states that his afternoon/evening blood pressures have been running 140/90.   Patient denies any chest pain, shortness of breath, or palpitations.   Patient only complaint is intermittent dizziness/syncopal episode.   Made patient an appointment to see Dr. Margaretann Loveless in office to discuss on 01/11/21. Advised patient to wear his compression stockings as well, and to continue to monitor blood pressure/symptoms.   Made patient aware of ED precautions should new or worsening symptoms develop in the mean time.   Advised patient to return call to office if any issues, questions, or concerns.   Patient verbalized understanding of all instructions.

## 2021-01-11 ENCOUNTER — Encounter: Payer: Self-pay | Admitting: Internal Medicine

## 2021-01-11 ENCOUNTER — Other Ambulatory Visit: Payer: Self-pay

## 2021-01-11 ENCOUNTER — Ambulatory Visit (INDEPENDENT_AMBULATORY_CARE_PROVIDER_SITE_OTHER): Payer: Medicare Other | Admitting: Internal Medicine

## 2021-01-11 VITALS — BP 140/83 | HR 68 | Ht 74.0 in | Wt 179.2 lb

## 2021-01-11 DIAGNOSIS — R55 Syncope and collapse: Secondary | ICD-10-CM | POA: Diagnosis not present

## 2021-01-11 DIAGNOSIS — I10 Essential (primary) hypertension: Secondary | ICD-10-CM

## 2021-01-11 DIAGNOSIS — I951 Orthostatic hypotension: Secondary | ICD-10-CM | POA: Diagnosis not present

## 2021-01-11 DIAGNOSIS — R931 Abnormal findings on diagnostic imaging of heart and coronary circulation: Secondary | ICD-10-CM

## 2021-01-11 DIAGNOSIS — R42 Dizziness and giddiness: Secondary | ICD-10-CM | POA: Diagnosis not present

## 2021-01-11 DIAGNOSIS — E785 Hyperlipidemia, unspecified: Secondary | ICD-10-CM | POA: Diagnosis not present

## 2021-01-11 DIAGNOSIS — Z79899 Other long term (current) drug therapy: Secondary | ICD-10-CM | POA: Diagnosis not present

## 2021-01-11 NOTE — Patient Instructions (Signed)
Medication Instructions:  HOLD LOSARTAN FOR ONE MONTH- MONITOR BLOOD PRESSURE DAILY  AND WRITE THESE NUMBERS DOWN, SEND READINGS THROUGH [place].  *If you need a refill on your cardiac medications before your next appointment, please call your pharmacy*  Follow-Up: At [facility], you and your health needs are our priority.  As part of our continuing mission to provide you with exceptional heart care, we have created designated Provider Care Teams.  These Care Teams include your primary Cardiologist (physician) and Advanced Practice Providers (APPs -  Physician Assistants and Nurse Practitioners) who all work together to provide you with the care you need, when you need it.  Your next appointment:   3 month(s)  The format for your next appointment:   In Person   Provider:   Cherlynn Kaiser, MD  Other Instructions  Hypotension As your heart beats, it forces blood through your body. This force is called blood pressure. If you have hypotension, you have low blood pressure. When your blood pressure is too low, you may not get enough blood to your brain or other parts of your body. This may cause you to feel weak, light-headed, have a fast heartbeat, or even pass out (faint). Low blood pressure may be harmless, or it may cause serious problems. What are the causes?  Blood loss.  Not enough water in the body (dehydration).  Heart problems.  Hormone problems.  Pregnancy.  A very bad infection.  Not having enough of certain nutrients.  Very bad allergic reactions.  Certain medicines. What increases the risk?  Age. The risk increases as you get older.  Conditions that affect the heart or the brain and spinal cord (central nervous system).  Taking certain medicines.  Being pregnant. What are the signs or symptoms?  Feeling: ? Weak. ? Light-headed. ? Dizzy. ? Tired (fatigued).  Blurred vision.  Fast heartbeat.  Passing out, in very bad cases. How is this  treated?  Changing your diet. This may involve eating more salt (sodium) or drinking more water.  Taking medicines to raise your blood pressure.  Changing how much you take (the dosage) of some of your medicines.  Wearing compression stockings. These stockings help to prevent blood clots and reduce swelling in your legs. In some cases, you may need to go to the hospital for:  Fluid replacement. This means you will receive fluids through an IV tube.  Blood replacement. This means you will receive donated blood through an IV tube (transfusion).  Treating an infection or heart problems, if this applies.  Monitoring. You may need to be monitored while medicines that you are taking wear off. Follow these instructions at home: Eating and drinking  Drink enough fluids to keep your pee (urine) pale yellow.  Eat a healthy diet. Follow instructions from your doctor about what you can eat or drink. A healthy diet includes: ? Fresh fruits and vegetables. ? Whole grains. ? Low-fat (lean) meats. ? Low-fat dairy products.  Eat extra salt only as told. Do not add extra salt to your diet unless your doctor tells you to.  Eat small meals often.  Avoid standing up quickly after you eat.   Medicines  Take over-the-counter and prescription medicines only as told by your doctor. ? Follow instructions from your doctor about changing how much you take of your medicines, if this applies. ? Do not stop or change any of your medicines on your own. General instructions  Wear compression stockings as told by your doctor.  Get  up slowly from lying down or sitting.  Avoid hot showers and a lot of heat as told by your doctor.  Return to your normal activities as told by your doctor. Ask what activities are safe for you.  Do not use any products that contain nicotine or tobacco, such as cigarettes, e-cigarettes, and chewing tobacco. If you need help quitting, ask your doctor.  Keep all follow-up  visits as told by your doctor. This is important.   Contact a doctor if:  You throw up (vomit).  You have watery poop (diarrhea).  You have a fever for more than 2-3 days.  You feel more thirsty than normal.  You feel weak and tired. Get help right away if:  You have chest pain.  You have a fast or uneven heartbeat.  You lose feeling (have numbness) in any part of your body.  You cannot move your arms or your legs.  You have trouble talking.  You get sweaty or feel light-headed.  You pass out.  You have trouble breathing.  You have trouble staying awake.  You feel mixed up (confused). Summary  Hypotension is also called low blood pressure. It is when the force of blood pumping through your arteries is too weak.  Hypotension may be harmless, or it may cause serious problems.  Treatment may include changing your diet and medicines, and wearing compression stockings.  In very bad cases, you may need to go to the hospital. This information is not intended to replace advice given to you by your health care provider. Make sure you discuss any questions you have with your health care provider. Document Revised: 03/01/2018 Document Reviewed: 03/01/2018 Elsevier Patient Education  [place].

## 2021-01-11 NOTE — Progress Notes (Signed)
Cardiology Office Note:    Date:  01/11/2021   ID:  George Fitzgerald, DOB December 28, 1950, MRN [id number]  PCP:  Cassandria Anger, MD  Cardiologist:  No primary care provider on file.  Electrophysiologist:  None   Referring MD: Cassandria Anger, MD   Chief Complaint/Reason for Referral: CAD, venous insufficiency  History of Present Illness:    George Fitzgerald is a 70 y.o. male with a history of  coronary artery calcifications and hyperlipidemia who presents for follow up.  Recently, he was sitting watching a movie and fell asleep in his chair, woke up and went to kitchen, in preparation for going to his room- had to hold onto counter and fainted.  [address] and hit his right side ribs, significant pain associated but no rib fractures per report.  He has been having dizziness for several years, about 1 x a week. Dizziness and presyncope, this predates our initiation of antihypertensive therapy.  He has a history of significant venous varicosities, and he has been wearing compression stockings but sometimes takes them off.  He has a prodrome of dizziness and lightheadedness which makes him have to hold onto things and shortly afterward he will recover and can continue moving.  This is the first time he has had a syncopal episode.  No palpitations, chest pain preceding.  He has otherwise been well.  He is tracking his blood pressure at home and mostly the readings are around [id number] systolic over 0000000 to [id number] diastolic.  Occasional blood pressures in the 140s.  He has been on losartan 25 mg daily for mild hypertension.  We discussed stopping this medicine briefly and observing his symptoms.  We also discussed conservative management of orthostatic hypotension.  Past Medical History:  Diagnosis Date  . Arthritis   . Ganglion cyst    L wrist, R foot  . GERD (gastroesophageal reflux disease)   . Gout   . Hyperlipidemia   . Hypothyroidism 05/06/2015  . 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 Varicose  veins    L    Past Surgical History:  Procedure Laterality Date  . COLONOSCOPY    . MASS EXCISION  07/28/2011   Procedure: EXCISION MASS/ left foot;  Surgeon: Wylene Simmer, MD;  Location: [facility];  Service: Orthopedics;  Laterality: Left;  left foot ganglion cyst excision  . VARICOSE VEIN SURGERY  2011   left leg    Current Medications: Current Meds  Medication Sig  . allopurinol (ZYLOPRIM) 300 MG tablet Take 1 tablet (300 mg total) by mouth daily.  . Ascorbic Acid (SUPER C COMPLEX PO) Take 2 tablets by mouth daily.  Marland Kitchen aspirin EC 81 MG tablet Take 81 mg by mouth daily. Swallow whole.  Marland Kitchen atorvastatin (LIPITOR) 40 MG tablet TAKE 1 TABLET(40 MG) BY MOUTH DAILY  . B Complex-C (SUPER B COMPLEX PO) Take 2 tablets by mouth daily.  . Cholecalciferol (VITAMIN D3) 25 MCG (1000 UT) CAPS Take 3,000 Units by mouth daily.  . colchicine 0.6 MG tablet Take 1 tablet (0.6 mg total) by mouth 4 (four) times daily as needed.  . Cyanocobalamin (VITAMIN B-12) 1000 MCG SUBL Place 1 tablet (1,000 mcg total) under the tongue daily.  . hydroxychloroquine (PLAQUENIL) 200 MG tablet Take 200 mg by mouth 2 (two) times daily. Use as needed when traveling  . indomethacin (INDOCIN) 50 MG capsule Take 1 capsule (50 mg total) by mouth 3 (three) times daily as needed.  Marland Kitchen omeprazole (PRILOSEC) 20 MG capsule Take 20  mg by mouth daily as needed.   . vitamin E 180 MG (400 UNITS) capsule Take 800 Units by mouth daily.     Allergies:   Patient has no known allergies.   Social History   Tobacco Use  . Smoking status: Never Smoker  . Smokeless tobacco: Never Used  Substance Use Topics  . Alcohol use: Yes    Alcohol/week: 14.0 standard drinks    Types: 14 Glasses of wine per week  . Drug use: No     Family History: The patient's family history includes Kidney disease (age of onset: 42) in his father; Nephritis (age of onset: 43) in his father; Stroke (age of onset: 41) in his mother.  ROS:   Please  see the history of present illness.    All other systems reviewed and are negative.  EKGs/Labs/Other Studies Reviewed:    The following studies were reviewed today:  EKG:  SR 1st deg AVB, iRBBB  Recent Labs: 05/11/2020: BUN 18; Creatinine, Ser 0.89; Potassium 5.4; Sodium 140  Recent Lipid Panel    Component Value Date/Time   CHOL 183 05/11/2020 1219   TRIG 40 05/11/2020 1219   HDL 128 05/11/2020 1219   CHOLHDL 1.4 05/11/2020 1219   CHOLHDL 3 01/01/2020 0847   VLDL 13.2 01/01/2020 0847   LDLCALC 46 05/11/2020 1219   LDLDIRECT 131.6 03/15/2012 0754    Physical Exam:    VS:  BP 140/83   Pulse 68   Ht 6\' 2"  (1.88 m)   Wt 179 lb 3.2 oz (81.3 kg)   SpO2 97%   BMI 23.01 kg/m     Wt Readings from Last 5 Encounters:  01/11/21 179 lb 3.2 oz (81.3 kg)  01/04/21 186 lb 9.6 oz (84.6 kg)  10/12/20 174 lb 14.4 oz (79.3 kg)  08/07/20 174 lb 8 oz (79.2 kg)  06/18/20 177 lb (80.3 kg)    Constitutional: No acute distress Eyes: sclera non-icteric, normal conjunctiva and lids ENMT: normal dentition, moist mucous membranes Cardiovascular: regular rhythm, normal rate, no murmurs. S1 and S2 normal. Radial pulses normal bilaterally. No jugular venous distention.  Respiratory: clear to auscultation bilaterally GI : normal bowel sounds, soft and nontender. No distention.   MSK: extremities warm, well perfused. No edema.  NEURO: grossly nonfocal exam, moves all extremities. PSYCH: alert and oriented x 3, normal mood and affect.   ASSESSMENT:    1. Dizziness   2. Syncope and collapse   3. Orthostatic hypotension   4. Dyslipidemia   5. Agatston coronary artery calcium score greater than 400   6. Essential hypertension   7. Medication management    PLAN:    Dizziness - Plan: EKG 12-Lead Syncope and collapse Orthostatic hypotension - Suspect this is related to venous pooling from significant venous varicosities bilaterally.  Recommend wearing compression stockings, adequate  hydration.  Recommended very intentional rise from a lying position to a seated position, and then seated position to a standing position.  This should take several minutes, so that blood pressure may equilibrate.  Recommended adequate hydration. -Given that he is on a very low-dose of losartan for mild hypertension, and he has lower blood pressures in the mornings which I suspect are contributing to his orthostasis, he will hold his losartan for approximately a month record his blood pressures and let us know what these recordings show.  Coronary artery calcifications Dyslipidemia- continue taking atorvastatin 40 mg daily  Essential hypertension-we will hold his losartan for approximately a month and we  will reassess blood pressures at that time.   Total time of encounter: 31 minutes total time of encounter, including 25 minutes spent in face-to-face patient care on the date of this encounter. This time includes coordination of care and counseling regarding above mentioned problem list. Remainder of non-face-to-face time involved reviewing chart documents/testing relevant to the patient encounter and documentation in the medical record. I have independently reviewed documentation from referring provider.   Cherlynn Kaiser, MD, [facility]    Medication Adjustments/Labs and Tests Ordered: Current medicines are reviewed at length with the patient today.  Concerns regarding medicines are outlined above.   Orders Placed This Encounter  Procedures  . EKG 12-Lead    No orders of the defined types were placed in this encounter.   Patient Instructions   Medication Instructions:  HOLD LOSARTAN FOR ONE MONTH- MONITOR BLOOD PRESSURE DAILY  AND WRITE THESE NUMBERS DOWN, SEND READINGS THROUGH [facility].  *If you need a refill on your cardiac medications before your next appointment, please call your pharmacy*  Follow-Up: At [facility], you and your health needs are our  priority.  As part of our continuing mission to provide you with exceptional heart care, we have created designated Provider Care Teams.  These Care Teams include your primary Cardiologist (physician) and Advanced Practice Providers (APPs -  Physician Assistants and Nurse Practitioners) who all work together to provide you with the care you need, when you need it.  Your next appointment:   3 month(s)  The format for your next appointment:   In Person   Provider:   Cherlynn Kaiser, MD  Other Instructions  Hypotension As your heart beats, it forces blood through your body. This force is called blood pressure. If you have hypotension, you have low blood pressure. When your blood pressure is too low, you may not get enough blood to your brain or other parts of your body. This may cause you to feel weak, light-headed, have a fast heartbeat, or even pass out (faint). Low blood pressure may be harmless, or it may cause serious problems. What are the causes?  Blood loss.  Not enough water in the body (dehydration).  Heart problems.  Hormone problems.  Pregnancy.  A very bad infection.  Not having enough of certain nutrients.  Very bad allergic reactions.  Certain medicines. What increases the risk?  Age. The risk increases as you get older.  Conditions that affect the heart or the brain and spinal cord (central nervous system).  Taking certain medicines.  Being pregnant. What are the signs or symptoms?  Feeling: ? Weak. ? Light-headed. ? Dizzy. ? Tired (fatigued).  Blurred vision.  Fast heartbeat.  Passing out, in very bad cases. How is this treated?  Changing your diet. This may involve eating more salt (sodium) or drinking more water.  Taking medicines to raise your blood pressure.  Changing how much you take (the dosage) of some of your medicines.  Wearing compression stockings. These stockings help to prevent blood clots and reduce swelling in your  legs. In some cases, you may need to go to the hospital for:  Fluid replacement. This means you will receive fluids through an IV tube.  Blood replacement. This means you will receive donated blood through an IV tube (transfusion).  Treating an infection or heart problems, if this applies.  Monitoring. You may need to be monitored while medicines that you are taking wear off. Follow these instructions at home: Eating and  drinking  Drink enough fluids to keep your pee (urine) pale yellow.  Eat a healthy diet. Follow instructions from your doctor about what you can eat or drink. A healthy diet includes: ? Fresh fruits and vegetables. ? Whole grains. ? Low-fat (lean) meats. ? Low-fat dairy products.  Eat extra salt only as told. Do not add extra salt to your diet unless your doctor tells you to.  Eat small meals often.  Avoid standing up quickly after you eat.   Medicines  Take over-the-counter and prescription medicines only as told by your doctor. ? Follow instructions from your doctor about changing how much you take of your medicines, if this applies. ? Do not stop or change any of your medicines on your own. General instructions  Wear compression stockings as told by your doctor.  Get up slowly from lying down or sitting.  Avoid hot showers and a lot of heat as told by your doctor.  Return to your normal activities as told by your doctor. Ask what activities are safe for you.  Do not use any products that contain nicotine or tobacco, such as cigarettes, e-cigarettes, and chewing tobacco. If you need help quitting, ask your doctor.  Keep all follow-up visits as told by your doctor. This is important.   Contact a doctor if:  You throw up (vomit).  You have watery poop (diarrhea).  You have a fever for more than 2-3 days.  You feel more thirsty than normal.  You feel weak and tired. Get help right away if:  You have chest pain.  You have a fast or uneven  heartbeat.  You lose feeling (have numbness) in any part of your body.  You cannot move your arms or your legs.  You have trouble talking.  You get sweaty or feel light-headed.  You pass out.  You have trouble breathing.  You have trouble staying awake.  You feel mixed up (confused). Summary  Hypotension is also called low blood pressure. It is when the force of blood pumping through your arteries is too weak.  Hypotension may be harmless, or it may cause serious problems.  Treatment may include changing your diet and medicines, and wearing compression stockings.  In very bad cases, you may need to go to the hospital. This information is not intended to replace advice given to you by your health care provider. Make sure you discuss any questions you have with your health care provider. Document Revised: 03/01/2018 Document Reviewed: 03/01/2018 Elsevier Patient Education  [place].

## 2021-01-14 ENCOUNTER — Other Ambulatory Visit: Payer: Self-pay | Admitting: Internal Medicine

## 2021-01-21 ENCOUNTER — Other Ambulatory Visit: Payer: Self-pay | Admitting: Internal Medicine

## 2021-02-09 ENCOUNTER — Ambulatory Visit (INDEPENDENT_AMBULATORY_CARE_PROVIDER_SITE_OTHER): Payer: Medicare Other

## 2021-02-09 ENCOUNTER — Other Ambulatory Visit: Payer: Self-pay

## 2021-02-09 ENCOUNTER — Ambulatory Visit: Payer: Medicare Other | Admitting: Internal Medicine

## 2021-02-09 VITALS — BP 130/80 | HR 81 | Temp 98.2°F | Ht 74.0 in | Wt 177.6 lb

## 2021-02-09 DIAGNOSIS — Z0000 Encounter for general adult medical examination without abnormal findings: Secondary | ICD-10-CM

## 2021-02-09 NOTE — Progress Notes (Signed)
Subjective:   George Fitzgerald is a 70 y.o. male who presents for Medicare Annual/Subsequent preventive examination.  Review of Systems    No ROS. Medicare Wellness Visit. Additional risk factors are reflected in social history. Cardiac Risk Factors include: advanced age (>51men, >79 women);dyslipidemia;family history of premature cardiovascular disease;male gender;smoking/ tobacco exposure     Objective:    Today's Vitals   02/09/21 1025  BP: 130/80  Pulse: 81  Temp: 98.2 F (36.8 C)  SpO2: 97%  Weight: 177 lb 9.6 oz (80.6 kg)  Height: 6\' 2"  (1.88 m)  PainSc: 0-No pain   Body mass index is 22.8 kg/m.  Advanced Directives 02/09/2021 07/28/2011 07/25/2011  Does Patient Have a Medical Advance Directive? Yes - Patient does not have advance directive  Type of Advance Directive Living will;Healthcare Power of Attorney - -  Does patient want to make changes to medical advance directive? No - Patient declined - -  Copy of [place] in Chart? No - copy requested - -  Pre-existing out of facility DNR order (yellow form or pink MOST form) - No -    Current Medications (verified) Outpatient Encounter Medications as of 02/09/2021  Medication Sig  . allopurinol (ZYLOPRIM) 300 MG tablet TAKE ONE TABLET BY MOUTH DAILY  . Ascorbic Acid (SUPER C COMPLEX PO) Take 2 tablets by mouth daily.  Marland Kitchen aspirin EC 81 MG tablet Take 81 mg by mouth daily. Swallow whole.  Marland Kitchen atorvastatin (LIPITOR) 40 MG tablet TAKE ONE TABLET BY MOUTH DAILY  . B Complex-C (SUPER B COMPLEX PO) Take 2 tablets by mouth daily.  . Cholecalciferol (VITAMIN D3) 25 MCG (1000 UT) CAPS Take 3,000 Units by mouth daily.  . colchicine 0.6 MG tablet Take 1 tablet (0.6 mg total) by mouth 4 (four) times daily as needed.  . Cyanocobalamin (VITAMIN B-12) 1000 MCG SUBL Place 1 tablet (1,000 mcg total) under the tongue daily.  . hydroxychloroquine (PLAQUENIL) 200 MG tablet Take 200 mg by mouth 2 (two) times daily. Use as  needed when traveling  . indomethacin (INDOCIN) 50 MG capsule Take 1 capsule (50 mg total) by mouth 3 (three) times daily as needed.  Marland Kitchen losartan (COZAAR) 25 MG tablet Take 1 tablet (25 mg total) by mouth daily.  Marland Kitchen omeprazole (PRILOSEC) 20 MG capsule Take 20 mg by mouth daily as needed.   . vitamin E 180 MG (400 UNITS) capsule Take 800 Units by mouth daily.   No facility-administered encounter medications on file as of 02/09/2021.    Allergies (verified) Patient has no known allergies.   History: Past Medical History:  Diagnosis Date  . Arthritis   . Ganglion cyst    L wrist, R foot  . GERD (gastroesophageal reflux disease)   . Gout   . Hyperlipidemia   . Hypothyroidism 05/06/2015  . 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 Varicose veins    L   Past Surgical History:  Procedure Laterality Date  . COLONOSCOPY    . MASS EXCISION  07/28/2011   Procedure: EXCISION MASS/ left foot;  Surgeon: Wylene Simmer, MD;  Location: [facility];  Service: Orthopedics;  Laterality: Left;  left foot ganglion cyst excision  . VARICOSE VEIN SURGERY  2011   left leg   Family History  Problem Relation Age of Onset  . Stroke Mother 45  . Nephritis Father 68  . Kidney disease Father 75       nephritis   Social History   Socioeconomic  History  . Marital status: Married    Spouse name: Not on file  . Number of children: 1  . Years of education: Not on file  . Highest education level: Not on file  Occupational History  . Occupation: retired  Tobacco Use  . Smoking status: Never Smoker  . Smokeless tobacco: Never Used  Substance and Sexual Activity  . Alcohol use: Yes    Alcohol/week: 14.0 standard drinks    Types: 14 Glasses of wine per week  . Drug use: No  . Sexual activity: Yes  Other Topics Concern  . Not on file  Social History Narrative   Regular exercise-yes,sailor   Social Determinants of Health   Financial Resource Strain: Low Risk   . Difficulty of  Paying Living Expenses: Not hard at all  Food Insecurity: No Food Insecurity  . Worried About Charity fundraiser in the Last Year: Never true  . Ran Out of Food in the Last Year: Never true  Transportation Needs: No Transportation Needs  . Lack of Transportation (Medical): No  . Lack of Transportation (Non-Medical): No  Physical Activity: Sufficiently Active  . Days of Exercise per Week: 5 days  . Minutes of Exercise per Session: 30 min  Stress: No Stress Concern Present  . Feeling of Stress : Not at all  Social Connections: Socially Integrated  . Frequency of Communication with Friends and Family: More than three times a week  . Frequency of Social Gatherings with Friends and Family: More than three times a week  . Attends Religious Services: More than 4 times per year  . Active Member of Clubs or Organizations: Yes  . Attends Archivist Meetings: More than 4 times per year  . 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 no  Interpreter Needed?: No  Information entered by :: Lisette Abu, LPN   Activities of Daily Living In your present state of health, do you have any difficulty performing the following activities: 02/09/2021  Hearing? N  Vision? N  Difficulty concentrating or making decisions? N  Walking or climbing stairs? N  Dressing or bathing? N  Doing errands, shopping? N  Preparing Food and eating ? N  Using the Toilet? N  In the past six months,  have you accidently leaked urine? N  Do you have problems with loss of bowel control? N  Managing your Medications? N  Managing your Finances? N  Housekeeping or managing your Housekeeping? N  Some recent data might be hidden    Patient Care Team: Plotnikov, Evie Lacks, MD as PCP - General Ladene Artist, MD (Gastroenterology)  Indicate any recent Medical Services you may have received from other than [facility] providers in the past year (date may be approximate).     Assessment:   This is a routine wellness examination for Damari.  Hearing/Vision screen No exam data present  Dietary issues and exercise activities discussed: Current Exercise Habits: Home exercise routine, Type of exercise: walking;stretching;strength training/weights;treadmill;Other -  see comments (gym 5 days a week/4 hrs a day; swim 35 laps, machines), Time (Minutes): 60, Frequency (Times/Week): 5, Weekly Exercise (Minutes/Week): 300, Intensity: Moderate  Goals Addressed              This Visit's Progress   .  Patient Stated (pt-stated)        Get a handle on my dizziness.      Depression Screen PHQ 2/9 Scores 02/09/2021 01/04/2020  PHQ - 2 Score 0 0    Fall Risk Fall Risk  02/09/2021 01/04/2021 01/04/2020 12/26/2019 05/20/2015  Falls in the past year? 1 - - 0 No  Number falls in past yr: 0 0 0 - -  Injury with Fall? 1 0 0 - -  Risk for fall due to : Other (Comment) Impaired balance/gait - - -  Risk for fall due to: Comment syncope & collapse pt states he tends to get dizzy when he gets up. Been going on now for abt a year, and his cardiologist is aware - - -  Follow up Falls evaluation completed - - Falls evaluation completed -    FALL RISK PREVENTION PERTAINING TO THE HOME:  Any stairs in or around the home? Yes  If so, are there any without handrails? No  Home free of loose throw rugs in walkways, pet beds, electrical cords, etc? Yes  Adequate lighting in your home to reduce risk of falls? Yes    ASSISTIVE DEVICES UTILIZED TO PREVENT FALLS:  Life alert? No  Use of a cane, walker or w/c? No  Grab bars in the bathroom? No  Shower chair or bench in shower? No  Elevated toilet seat or a handicapped toilet? No   TIMED UP AND GO:  Was the test performed? No .  Length of time to ambulate 10 feet: 0 sec.   Gait steady and fast without use of assistive device  Cognitive Function: Normal cognitive status assessed by direct observation by this Nurse Health Advisor. No abnormalities found.          Immunizations Immunization History  Administered Date(s) Administered  . Influenza Split 06/29/2011  . Influenza,inj,Quad PF,6+ Mos 08/20/2013, 05/20/2015  . Influenza-Unspecified 08/04/2016  . Moderna Sars-Covid-2 Vaccination 11/02/2019, 11/30/2019, 05/22/2020  . Pneumococcal Polysaccharide-23 05/20/2015  . Tdap 02/23/2011    TDAP status: Due, Education has been provided regarding the importance of this vaccine. Advised may receive this vaccine at local pharmacy or Health Dept. Aware to provide a copy of the vaccination record if obtained from local pharmacy or Health Dept. Verbalized acceptance and understanding.  Flu Vaccine status: Up to date  Pneumococcal vaccine status: Due, Education has been provided regarding the importance of this vaccine. Advised may receive this vaccine at local pharmacy or Health Dept. Aware to provide a copy of the vaccination record if obtained from local pharmacy or Health Dept. Verbalized acceptance and understanding.  Covid-19 vaccine status: Completed vaccines  Qualifies for Shingles Vaccine? Yes   Zostavax completed No   Shingrix Completed?: No.    Education has been provided regarding the importance of this vaccine. Patient has been advised to call insurance company to determine out of pocket expense if they have not yet received this vaccine. Advised may also receive vaccine at local pharmacy or Health Dept. Verbalized acceptance and  understanding.  Screening Tests Health Maintenance  Topic Date Due  . PNA vac Low Risk Adult (2 of 2 - PCV13) 05/19/2016  . TETANUS/TDAP  02/22/2021  . INFLUENZA VACCINE  04/19/2021  . COLONOSCOPY (Pts 45-59yrs Insurance coverage will need to be confirmed)  04/02/2029  . COVID-19 Vaccine  Completed  . Hepatitis C Screening  Completed  . HPV VACCINES  Aged Out    Health Maintenance  Health Maintenance Due  Topic Date Due  . PNA vac Low Risk Adult (2 of 2 - PCV13) 05/19/2016    Colorectal cancer screening: Type of screening: Colonoscopy. Completed 04/03/2019. Repeat every 10 years  Lung Cancer Screening: (Low Dose CT Chest recommended if Age 28-80 years, 30 pack-year currently smoking OR have quit w/in 15years.) does not qualify.   Lung Cancer Screening Referral: no  Additional Screening:  Hepatitis C Screening: does qualify; Completed yes  Vision Screening: Recommended annual ophthalmology exams for early detection of glaucoma and other disorders of the eye. Is the patient up to date with their annual eye exam?  Yes  Who is the provider or what is the name of the office in which the patient attends annual eye exams? Alain Honey, OD. If pt is not established with a provider, would they like to be referred to a provider to establish care? No .   Dental Screening: Recommended annual dental exams for proper oral hygiene  Community Resource Referral / Chronic Care Management: CRR required this visit?  No   CCM required this visit?  No      Plan:     I have personally reviewed and noted the following in the patient's chart:   . Medical and social history . Use of alcohol, tobacco or illicit drugs  . Current medications and supplements including opioid prescriptions. Patient is not currently taking opioid prescriptions. . Functional ability and status . Nutritional status . Physical activity . Advanced directives . List of other physicians . Hospitalizations,  surgeries, and ER visits in previous 12 months . Vitals . Screenings to include cognitive, depression, and falls . Referrals and appointments  In addition, I have reviewed and discussed with patient certain preventive protocols, quality metrics, and best practice recommendations. A written personalized care plan for preventive services as well as general preventive health recommendations were provided to patient.     Sheral Flow, LPN   02/04/8415   Nurse Notes: n/a

## 2021-02-09 NOTE — Patient Instructions (Signed)
Mr. Xiong , Thank you for taking time to come for your Medicare Wellness Visit. I appreciate your ongoing commitment to your health goals. Please review the following plan we discussed and let me know if I can assist you in the future.   Screening recommendations/referrals: Colonoscopy: 04/03/2019; due every 10 years Recommended yearly ophthalmology/optometry visit for glaucoma screening and checkup Recommended yearly dental visit for hygiene and checkup  Vaccinations: Influenza vaccine: 06/13/2020 Pneumococcal vaccine: 05/20/2015 Tdap vaccine: 02/23/2011; due every 10 years Shingles vaccine: never done  Covid-19: 11/02/2019, 11/30/2019, 05/22/2020  Advanced directives: Please bring a copy of your health care power of attorney and living will to the office at your convenience.  Conditions/risks identified: Yes; Reviewed health maintenance screenings with patient today and relevant education, vaccines, and/or referrals were provided. Please continue to do your personal lifestyle choices by: daily care of teeth and gums, regular physical activity (goal should be 5 days a week for 30 minutes), eat a healthy diet, avoid tobacco and drug use, limiting any alcohol intake, taking a low-dose aspirin (if not allergic or have been advised by your provider otherwise) and taking vitamins and minerals as recommended by your provider. Continue doing brain stimulating activities (puzzles, reading, adult coloring books, staying active) to keep memory sharp. Continue to eat heart healthy diet (full of fruits, vegetables, whole grains, lean protein, water--limit salt, fat, and sugar intake) and increase physical activity as tolerated.  Next appointment: Please schedule your next Medicare Wellness Visit with your Nurse Health Advisor in 1 year by calling [phone].  Preventive Care 72 Years and Older, Male Preventive care refers to lifestyle choices and visits with your health care provider that can promote health and  wellness. What does preventive care include?  A yearly physical exam. This is also called an annual well check.  Dental exams once or twice a year.  Routine eye exams. Ask your health care provider how often you should have your eyes checked.  Personal lifestyle choices, including:  Daily care of your teeth and gums.  Regular physical activity.  Eating a healthy diet.  Avoiding tobacco and drug use.  Limiting alcohol use.  Practicing safe sex.  Taking low doses of aspirin every day.  Taking vitamin and mineral supplements as recommended by your health care provider. What happens during an annual well check? The services and screenings done by your health care provider during your annual well check will depend on your age, overall health, lifestyle risk factors, and family history of disease. Counseling  Your health care provider may ask you questions about your:  Alcohol use.  Tobacco use.  Drug use.  Emotional well-being.  Home and relationship well-being.  Sexual activity.  Eating habits.  History of falls.  Memory and ability to understand (cognition).  Work and work Statistician. Screening  You may have the following tests or measurements:  Height, weight, and BMI.  Blood pressure.  Lipid and cholesterol levels. These may be checked every 5 years, or more frequently if you are over 72 years old.  Skin check.  Lung cancer screening. You may have this screening every year starting at age 72 if you have a 30-pack-year history of smoking and currently smoke or have quit within the past 15 years.  Fecal occult blood test (FOBT) of the stool. You may have this test every year starting at age 72.  Flexible sigmoidoscopy or colonoscopy. You may have a sigmoidoscopy every 5 years or a colonoscopy every 10 years starting at age  72.  Prostate cancer screening. Recommendations will vary depending on your family history and other risks.  Hepatitis C blood  test.  Hepatitis B blood test.  Sexually transmitted disease (STD) testing.  Diabetes screening. This is done by checking your blood sugar (glucose) after you have not eaten for a while (fasting). You may have this done every 1-3 years.  Abdominal aortic aneurysm (AAA) screening. You may need this if you are a current or former smoker.  Osteoporosis. You may be screened starting at age 72 if you are at high risk. Talk with your health care provider about your test results, treatment options, and if necessary, the need for more tests. Vaccines  Your health care provider may recommend certain vaccines, such as:  Influenza vaccine. This is recommended every year.  Tetanus, diphtheria, and acellular pertussis (Tdap, Td) vaccine. You may need a Td booster every 10 years.  Zoster vaccine. You may need this after age 72.  Pneumococcal 13-valent conjugate (PCV13) vaccine. One dose is recommended after age 72.  Pneumococcal polysaccharide (PPSV23) vaccine. One dose is recommended after age 72. Talk to your health care provider about which screenings and vaccines you need and how often you need them. This information is not intended to replace advice given to you by your health care provider. Make sure you discuss any questions you have with your health care provider. Document Released: 10/02/2015 Document Revised: 05/25/2016 Document Reviewed: 07/07/2015 Elsevier Interactive Patient Education  2017 [place] Prevention in the Home Falls can cause injuries. They can happen to people of all ages. There are many things you can do to make your home safe and to help prevent falls. What can I do on the outside of my home?  Regularly fix the edges of walkways and driveways and fix any cracks.  Remove anything that might make you trip as you walk through a door, such as a raised step or threshold.  Trim any bushes or trees on the path to your home.  Use bright outdoor  lighting.  Clear any walking paths of anything that might make someone trip, such as rocks or tools.  Regularly check to see if handrails are loose or broken. Make sure that both sides of any steps have handrails.  Any raised decks and porches should have guardrails on the edges.  Have any leaves, snow, or ice cleared regularly.  Use sand or salt on walking paths during winter.  Clean up any spills in your garage right away. This includes oil or grease spills. What can I do in the bathroom?  Use night lights.  Install grab bars by the toilet and in the tub and shower. Do not use towel bars as grab bars.  Use non-skid mats or decals in the tub or shower.  If you need to sit down in the shower, use a plastic, non-slip stool.  Keep the floor dry. Clean up any water that spills on the floor as soon as it happens.  Remove soap buildup in the tub or shower regularly.  Attach bath mats securely with double-sided non-slip rug tape.  Do not have throw rugs and other things on the floor that can make you trip. What can I do in the bedroom?  Use night lights.  Make sure that you have a light by your bed that is easy to reach.  Do not use any sheets or blankets that are too big for your bed. They should not hang down onto the floor.  Have a firm chair that has side arms. You can use this for support while you get dressed.  Do not have throw rugs and other things on the floor that can make you trip. What can I do in the kitchen?  Clean up any spills right away.  Avoid walking on wet floors.  Keep items that you use a lot in easy-to-reach places.  If you need to reach something above you, use a strong step stool that has a grab bar.  Keep electrical cords out of the way.  Do not use floor polish or wax that makes floors slippery. If you must use wax, use non-skid floor wax.  Do not have throw rugs and other things on the floor that can make you trip. What can I do with my  stairs?  Do not leave any items on the stairs.  Make sure that there are handrails on both sides of the stairs and use them. Fix handrails that are broken or loose. Make sure that handrails are as long as the stairways.  Check any carpeting to make sure that it is firmly attached to the stairs. Fix any carpet that is loose or worn.  Avoid having throw rugs at the top or bottom of the stairs. If you do have throw rugs, attach them to the floor with carpet tape.  Make sure that you have a light switch at the top of the stairs and the bottom of the stairs. If you do not have them, ask someone to add them for you. What else can I do to help prevent falls?  Wear shoes that:  Do not have high heels.  Have rubber bottoms.  Are comfortable and fit you well.  Are closed at the toe. Do not wear sandals.  If you use a stepladder:  Make sure that it is fully opened. Do not climb a closed stepladder.  Make sure that both sides of the stepladder are locked into place.  Ask someone to hold it for you, if possible.  Clearly mark and make sure that you can see:  Any grab bars or handrails.  First and last steps.  Where the edge of each step is.  Use tools that help you move around (mobility aids) if they are needed. These include:  Canes.  Walkers.  Scooters.  Crutches.  Turn on the lights when you go into a dark area. Replace any light bulbs as soon as they burn out.  Set up your furniture so you have a clear path. Avoid moving your furniture around.  If any of your floors are uneven, fix them.  If there are any pets around you, be aware of where they are.  Review your medicines with your doctor. Some medicines can make you feel dizzy. This can increase your chance of falling. Ask your doctor what other things that you can do to help prevent falls. This information is not intended to replace advice given to you by your health care provider. Make sure you discuss any  questions you have with your health care provider. Document Released: 07/02/2009 Document Revised: 02/11/2016 Document Reviewed: 10/10/2014 Elsevier Interactive Patient Education  2017 [employer].

## 2021-02-23 ENCOUNTER — Encounter: Payer: Self-pay | Admitting: Internal Medicine

## 2021-02-23 ENCOUNTER — Ambulatory Visit (INDEPENDENT_AMBULATORY_CARE_PROVIDER_SITE_OTHER): Payer: Medicare Other | Admitting: Internal Medicine

## 2021-02-23 ENCOUNTER — Other Ambulatory Visit: Payer: Self-pay

## 2021-02-23 VITALS — BP 140/78 | HR 78 | Temp 98.0°F | Ht 74.0 in | Wt 177.8 lb

## 2021-02-23 DIAGNOSIS — E559 Vitamin D deficiency, unspecified: Secondary | ICD-10-CM

## 2021-02-23 DIAGNOSIS — I2583 Coronary atherosclerosis due to lipid rich plaque: Secondary | ICD-10-CM

## 2021-02-23 DIAGNOSIS — I2510 Atherosclerotic heart disease of native coronary artery without angina pectoris: Secondary | ICD-10-CM

## 2021-02-23 DIAGNOSIS — R42 Dizziness and giddiness: Secondary | ICD-10-CM | POA: Diagnosis not present

## 2021-02-23 DIAGNOSIS — E538 Deficiency of other specified B group vitamins: Secondary | ICD-10-CM

## 2021-02-23 DIAGNOSIS — N320 Bladder-neck obstruction: Secondary | ICD-10-CM

## 2021-02-23 DIAGNOSIS — Z0000 Encounter for general adult medical examination without abnormal findings: Secondary | ICD-10-CM

## 2021-02-23 DIAGNOSIS — R7309 Other abnormal glucose: Secondary | ICD-10-CM

## 2021-02-23 DIAGNOSIS — Z23 Encounter for immunization: Secondary | ICD-10-CM

## 2021-02-23 DIAGNOSIS — E034 Atrophy of thyroid (acquired): Secondary | ICD-10-CM | POA: Diagnosis not present

## 2021-02-23 DIAGNOSIS — E785 Hyperlipidemia, unspecified: Secondary | ICD-10-CM

## 2021-02-23 LAB — COMPREHENSIVE METABOLIC PANEL
ALT: 31 U/L (ref 0–53)
AST: 37 U/L (ref 0–37)
Albumin: 4.8 g/dL (ref 3.5–5.2)
Alkaline Phosphatase: 114 U/L (ref 39–117)
BUN: 23 mg/dL (ref 6–23)
CO2: 23 mEq/L (ref 19–32)
Calcium: 9.8 mg/dL (ref 8.4–10.5)
Chloride: 105 mEq/L (ref 96–112)
Creatinine, Ser: 1.2 mg/dL (ref 0.40–1.50)
GFR: 60.63 mL/min (ref >60.00)
Glucose, Bld: 103 mg/dL — ABNORMAL HIGH (ref 70–99)
Potassium: 4.6 mEq/L (ref 3.5–5.1)
Sodium: 142 mEq/L (ref 135–145)
Total Bilirubin: 0.8 mg/dL (ref 0.2–1.2)
Total Protein: 7.3 g/dL (ref 6.0–8.3)

## 2021-02-23 LAB — CBC WITH DIFFERENTIAL/PLATELET
Basophils Absolute: 0.1 10*3/uL (ref 0.0–0.1)
Basophils Relative: 1.7 % (ref 0.0–3.0)
Eosinophils Absolute: 0.1 10*3/uL (ref 0.0–0.7)
Eosinophils Relative: 1.4 % (ref 0.0–5.0)
HCT: 39.6 % (ref 39.0–52.0)
Hemoglobin: 13.5 g/dL (ref 13.0–17.0)
Lymphocytes Relative: 22 % (ref 12.0–46.0)
Lymphs Abs: 1.2 10*3/uL (ref 0.7–4.0)
MCHC: 34.1 g/dL (ref 30.0–36.0)
MCV: 103.2 fl — ABNORMAL HIGH (ref 78.0–100.0)
Monocytes Absolute: 0.6 10*3/uL (ref 0.1–1.0)
Monocytes Relative: 10.5 % (ref 3.0–12.0)
Neutro Abs: 3.4 10*3/uL (ref 1.4–7.7)
Neutrophils Relative %: 64.4 % (ref 43.0–77.0)
Platelets: 224 10*3/uL (ref 150.0–400.0)
RBC: 3.84 Mil/uL — ABNORMAL LOW (ref 4.22–5.81)
RDW: 14 % (ref 11.5–15.5)
WBC: 5.3 10*3/uL (ref 4.0–10.5)

## 2021-02-23 LAB — LIPID PANEL
Cholesterol: 181 mg/dL (ref 0–200)
HDL: 110.7 mg/dL (ref >39.00)
LDL Cholesterol: 63 mg/dL (ref 0–99)
NonHDL: 70.56
Total CHOL/HDL Ratio: 2
Triglycerides: 38 mg/dL (ref 0.0–149.0)
VLDL: 7.6 mg/dL (ref 0.0–40.0)

## 2021-02-23 LAB — CORTISOL: Cortisol, Plasma: 12.2 ug/dL

## 2021-02-23 LAB — PSA: PSA: 1.17 ng/mL (ref 0.10–4.00)

## 2021-02-23 LAB — VITAMIN B12: Vitamin B-12: 387 pg/mL (ref 211–911)

## 2021-02-23 LAB — TSH: TSH: 4.58 u[IU]/mL — ABNORMAL HIGH (ref 0.35–4.50)

## 2021-02-23 MED ORDER — COLCHICINE 0.6 MG PO TABS: 0.6000 mg | ORAL_TABLET | Freq: Four times a day (QID) | ORAL | 5 refills | Status: AC | PRN

## 2021-02-23 NOTE — Assessment & Plan Note (Signed)
Card CT IMPRESSION: Coronary calcium score of 1173. This was 87th percentile for age and sex matched control.  On Lipitor

## 2021-02-23 NOTE — Assessment & Plan Note (Signed)
Orthostatic Off BP meds Check cortisol

## 2021-02-23 NOTE — Assessment & Plan Note (Signed)
On Synthroid

## 2021-02-23 NOTE — Assessment & Plan Note (Signed)
On Vit D 

## 2021-02-23 NOTE — Assessment & Plan Note (Signed)

## 2021-02-23 NOTE — Assessment & Plan Note (Signed)
F/u w/Dr Margaretann Loveless ASA, Lipitor

## 2021-02-23 NOTE — Addendum Note (Signed)
Addended by: Boris Lown B on: 02/23/2021 11:30 AM   Modules accepted: Orders

## 2021-02-23 NOTE — Assessment & Plan Note (Signed)
Check A1c. 

## 2021-02-23 NOTE — Assessment & Plan Note (Signed)
On B12 

## 2021-02-23 NOTE — Addendum Note (Signed)
Addended by: Earnstine Regal on: 02/23/2021 11:54 AM   Modules accepted: Orders

## 2021-02-23 NOTE — Progress Notes (Signed)
Subjective:  Patient ID: George Fitzgerald, male    DOB: Nov 27, 1950  Age: 70 y.o. MRN: [id number]  CC: Annual Exam (Req written rx for the colchicine)   HPI George Fitzgerald presents for lightheadedness - (orthostatic)  Outpatient Medications Prior to Visit  Medication Sig Dispense Refill  . allopurinol (ZYLOPRIM) 300 MG tablet TAKE ONE TABLET BY MOUTH DAILY 90 tablet 3  . Ascorbic Acid (SUPER C COMPLEX PO) Take 2 tablets by mouth daily.    Marland Kitchen aspirin EC 81 MG tablet Take 81 mg by mouth daily. Swallow whole.    Marland Kitchen atorvastatin (LIPITOR) 40 MG tablet TAKE ONE TABLET BY MOUTH DAILY 90 tablet 3  . B Complex-C (SUPER B COMPLEX PO) Take 2 tablets by mouth daily.    . Cholecalciferol (VITAMIN D3) 25 MCG (1000 UT) CAPS Take 3,000 Units by mouth daily.    . Cyanocobalamin (VITAMIN B-12) 1000 MCG SUBL Place 1 tablet (1,000 mcg total) under the tongue daily. 100 tablet 3  . hydroxychloroquine (PLAQUENIL) 200 MG tablet Take 200 mg by mouth 2 (two) times daily. Use as needed when traveling    . indomethacin (INDOCIN) 50 MG capsule Take 1 capsule (50 mg total) by mouth 3 (three) times daily as needed. 60 capsule 3  . omeprazole (PRILOSEC) 20 MG capsule Take 20 mg by mouth daily as needed.     . vitamin E 180 MG (400 UNITS) capsule Take 800 Units by mouth daily.    . colchicine 0.6 MG tablet Take 1 tablet (0.6 mg total) by mouth 4 (four) times daily as needed. 30 tablet 5  . losartan (COZAAR) 25 MG tablet Take 1 tablet (25 mg total) by mouth daily. 90 tablet 3   No facility-administered medications prior to visit.    ROS: Review of Systems  Constitutional: Negative for appetite change, fatigue and unexpected weight change.  HENT: Negative for congestion, nosebleeds, sneezing, sore throat and trouble swallowing.   Eyes: Negative for itching and visual disturbance.  Respiratory: Negative for cough.   Cardiovascular: Negative for chest pain, palpitations and leg swelling.  Gastrointestinal: Negative for  abdominal distention, blood in stool, diarrhea and nausea.  Genitourinary: Negative for frequency and hematuria.  Musculoskeletal: Negative for back pain, gait problem, joint swelling and neck pain.  Skin: Negative for rash.  Neurological: Negative for dizziness, tremors, speech difficulty and weakness.  Psychiatric/Behavioral: Negative for agitation, dysphoric mood and sleep disturbance. The patient is not nervous/anxious.     Objective:  BP 140/78 (BP Location: Left Arm)   Pulse 78   Temp 98 F (36.7 C) (Oral)   Ht 6\' 2"  (1.88 m)   Wt 177 lb 12.8 oz (80.6 kg)   SpO2 97%   BMI 22.83 kg/m   BP Readings from Last 3 Encounters:  02/23/21 140/78  02/09/21 130/80  01/11/21 140/83    Wt Readings from Last 3 Encounters:  02/23/21 177 lb 12.8 oz (80.6 kg)  02/09/21 177 lb 9.6 oz (80.6 kg)  01/11/21 179 lb 3.2 oz (81.3 kg)    Physical Exam Constitutional:      General: He is not in acute distress.    Appearance: He is well-developed.     Comments: NAD  Eyes:     Conjunctiva/sclera: Conjunctivae normal.     Pupils: Pupils are equal, round, and reactive to light.  Neck:     Thyroid: No thyromegaly.     Vascular: No JVD.  Cardiovascular:     Rate and Rhythm: Normal rate and  regular rhythm.     Heart sounds: Normal heart sounds. No murmur heard. No friction rub. No gallop.   Pulmonary:     Effort: Pulmonary effort is normal. No respiratory distress.     Breath sounds: Normal breath sounds. No wheezing or rales.  Chest:     Chest wall: No tenderness.  Abdominal:     General: Bowel sounds are normal. There is no distension.     Palpations: Abdomen is soft. There is no mass.     Tenderness: There is no abdominal tenderness. There is no guarding or rebound.  Musculoskeletal:        General: Tenderness present. Normal range of motion.     Cervical back: Normal range of motion.  Lymphadenopathy:     Cervical: No cervical adenopathy.  Skin:    General: Skin is warm and dry.      Findings: No rash.  Neurological:     Mental Status: He is alert and oriented to person, place, and time.     Cranial Nerves: No cranial nerve deficit.     Motor: No abnormal muscle tone.     Coordination: Coordination normal.     Gait: Gait normal.     Deep Tendon Reflexes: Reflexes are normal and symmetric.  Psychiatric:        Behavior: Behavior normal.        Thought Content: Thought content normal.        Judgment: Judgment normal.   Rectal - per GI  Lab Results  Component Value Date   WBC 5.1 01/01/2020   HGB 14.4 01/01/2020   HCT 42.0 01/01/2020   PLT 179.0 01/01/2020   GLUCOSE 88 05/11/2020   CHOL 183 05/11/2020   TRIG 40 05/11/2020   HDL 128 05/11/2020   LDLDIRECT 131.6 03/15/2012   LDLCALC 46 05/11/2020   ALT 24 01/01/2020   AST 27 01/01/2020   NA 140 05/11/2020   K 5.4 (H) 05/11/2020   CL 101 05/11/2020   CREATININE 0.89 05/11/2020   BUN 18 05/11/2020   CO2 25 05/11/2020   TSH 5.74 (H) 01/01/2020   PSA 0.91 01/01/2020   HGBA1C 5.6 08/28/2018    CT CARDIAC SCORING  Addendum Date: 01/10/2020   ADDENDUM REPORT: 01/10/2020 11:22 CLINICAL DATA:  Risk stratification, CAD monitoring, dyslipidemia EXAM: Coronary Calcium Score TECHNIQUE: The patient was scanned on a [employer] scanner. Axial non-contrast 3 mm slices were carried out through the heart. The data set was analyzed on a dedicated work station and scored using the Walker. FINDINGS: Non-cardiac: See separate report from [facility]. Ascending Aorta: 37 mm at mid ascending aorta measured in an axial plane. Pericardium: Normal, trace pericardial fluid. Coronary arteries: Normal coronary origins. IMPRESSION: Coronary calcium score of 1173. This was 87th percentile for age and sex matched control. Electronically Signed   By: Cherlynn Kaiser   On: 01/10/2020 11:22   Result Date: 01/10/2020 EXAM: OVER-READ INTERPRETATION  CT CHEST The following report is an over-read performed by radiologist  Dr. Vinnie Langton of [facility], [place] on 01/09/2020. This over-read does not include interpretation of cardiac or coronary anatomy or pathology. The coronary calcium score interpretation by the cardiologist is attached. COMPARISON:  None. FINDINGS: Within the visualized portions of the thorax there are no suspicious appearing pulmonary nodules or masses, there is no acute consolidative airspace disease, no pleural effusions, no pneumothorax and no lymphadenopathy. Visualized portions of the upper abdomen demonstrates mild diffuse low attenuation throughout the visualized hepatic  parenchyma. There are no aggressive appearing lytic or blastic lesions noted in the visualized portions of the skeleton. IMPRESSION: 1. Mild diffuse hepatic steatosis. Electronically Signed: By: Vinnie Langton M.D. On: 01/09/2020 16:35    Assessment & Plan:    Walker Kehr, MD

## 2021-02-24 LAB — URINALYSIS
Bilirubin Urine: NEGATIVE
Hgb urine dipstick: NEGATIVE
Ketones, ur: NEGATIVE
Leukocytes,Ua: NEGATIVE
Nitrite: NEGATIVE
Specific Gravity, Urine: 1.02 (ref 1.000–1.030)
Total Protein, Urine: NEGATIVE
Urine Glucose: NEGATIVE
Urobilinogen, UA: 0.2 (ref 0.0–1.0)
pH: 6 (ref 5.0–8.0)

## 2021-03-02 ENCOUNTER — Other Ambulatory Visit: Payer: Self-pay | Admitting: Internal Medicine

## 2021-03-02 DIAGNOSIS — E034 Atrophy of thyroid (acquired): Secondary | ICD-10-CM

## 2021-03-02 DIAGNOSIS — M109 Gout, unspecified: Secondary | ICD-10-CM

## 2021-03-02 DIAGNOSIS — R7309 Other abnormal glucose: Secondary | ICD-10-CM

## 2021-03-11 NOTE — Telephone Encounter (Signed)
Please see other MyChart encounter. Patient has follow up appointment on 7/28 in office with Dr. Margaretann Loveless.

## 2021-03-12 DIAGNOSIS — H2513 Age-related nuclear cataract, bilateral: Secondary | ICD-10-CM | POA: Diagnosis not present

## 2021-03-12 DIAGNOSIS — H52223 Regular astigmatism, bilateral: Secondary | ICD-10-CM | POA: Diagnosis not present

## 2021-03-12 DIAGNOSIS — H5203 Hypermetropia, bilateral: Secondary | ICD-10-CM | POA: Diagnosis not present

## 2021-03-21 DIAGNOSIS — Z20822 Contact with and (suspected) exposure to covid-19: Secondary | ICD-10-CM | POA: Diagnosis not present

## 2021-04-15 ENCOUNTER — Ambulatory Visit (INDEPENDENT_AMBULATORY_CARE_PROVIDER_SITE_OTHER): Payer: Medicare Other | Admitting: Internal Medicine

## 2021-04-15 ENCOUNTER — Other Ambulatory Visit: Payer: Self-pay

## 2021-04-15 ENCOUNTER — Encounter: Payer: Self-pay | Admitting: Internal Medicine

## 2021-04-15 VITALS — BP 126/70 | HR 72 | Ht 74.0 in | Wt 183.0 lb

## 2021-04-15 DIAGNOSIS — R55 Syncope and collapse: Secondary | ICD-10-CM

## 2021-04-15 DIAGNOSIS — E785 Hyperlipidemia, unspecified: Secondary | ICD-10-CM | POA: Diagnosis not present

## 2021-04-15 DIAGNOSIS — R931 Abnormal findings on diagnostic imaging of heart and coronary circulation: Secondary | ICD-10-CM

## 2021-04-15 DIAGNOSIS — I10 Essential (primary) hypertension: Secondary | ICD-10-CM

## 2021-04-15 DIAGNOSIS — R7989 Other specified abnormal findings of blood chemistry: Secondary | ICD-10-CM

## 2021-04-15 DIAGNOSIS — R42 Dizziness and giddiness: Secondary | ICD-10-CM

## 2021-04-15 DIAGNOSIS — I951 Orthostatic hypotension: Secondary | ICD-10-CM | POA: Diagnosis not present

## 2021-04-15 NOTE — Progress Notes (Signed)
Cardiology Office Note:    Date:  04/15/2021   ID:  George Fitzgerald, DOB [birth year], MRN [id number]  PCP:  Cassandria Anger, MD  Cardiologist:  None  Electrophysiologist:  None   Referring MD: Cassandria Anger, MD   Chief Complaint/Reason for Referral: CAD, venous insufficiency  History of Present Illness:    George Fitzgerald is a 70 y.o. male with a history of  coronary artery calcifications and hyperlipidemia who presents for follow up.  He is still experiencing some dizziness and he mentions he has been recently diagnosed with cataracts. He has a surgery for cataract removal in August. He wonders if some of the dizziness will resolve after his vision is optimized. He mentions that the dizziness reduces when he changes positions.   He stopped taking losartan in May and has been doing well. He keeps a blood pressure log at home and reports he does not feel different when his blood pressure is low. BP Readings from Last 3 Encounters:  04/15/21 126/70  02/23/21 140/78  02/09/21 130/80    He mentions feeling "heavy" at the back of his legs and has a history of  varicose veins. He mentions not feeling pain and wears compression stockings to help with his legs. He reports that the stockings help with his veins but not the dizziness.   He has been managing a strict with no dairy, no white potatoes, no white rice and no carbs. He has been managing his cholesterol levels with 40 mg atorvastatin PO daily.  He stopped going to the gym because of his dizziness but recently started this week due to more time on his hands. He has been recently busy because he has been taking care of his sick dog. He is trying to lose some weight and mentions he has not sailed recently. He used to drink alcohol liberally but has cut down on his alcohol intake upon my suggestion.  He denies chest pain, shortness of breath, palpitations, headaches, syncope, orthopnea, PND.   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   aspirin EC 81 MG tablet Take 81 mg by mouth daily. Swallow whole.   atorvastatin (LIPITOR) 40 MG tablet TAKE ONE TABLET BY MOUTH DAILY   B Complex-C (SUPER B COMPLEX PO) Take 2 tablets by mouth daily.   Cholecalciferol (VITAMIN D3) 25 MCG (1000 UT) CAPS Take 3,000 Units by mouth daily.   colchicine 0.6 MG tablet Take 1 tablet (0.6 mg total) by mouth 4 (four) times daily as needed.   Cyanocobalamin (VITAMIN B-12) 1000 MCG SUBL Place 1 tablet (1,000 mcg total) under the tongue daily.   hydroxychloroquine (PLAQUENIL) 200  MG tablet Take 200 mg by mouth 2 (two) times daily. Use as needed when traveling   indomethacin (INDOCIN) 50 MG capsule Take 1 capsule (50 mg total) by mouth 3 (three) times daily as needed.   omeprazole (PRILOSEC) 20 MG capsule Take 20 mg by mouth daily as needed.    vitamin E 180 MG (400 UNITS) capsule Take 800 Units by mouth daily.     Allergies:   Patient has no known allergies.   Social History   Tobacco Use   Smoking status: Never   Smokeless tobacco: Never  Substance Use Topics   Alcohol use: Yes    Alcohol/week: 14.0 standard drinks    Types: 14 Glasses of wine per week   Drug use: No     Family History: The patient's family history  includes Kidney disease (age of onset: 2) in his father; Nephritis (age of onset: 11) in his father; Stroke (age of onset: 79) in his mother.  ROS:   Please see the history of present illness.   (+) dizziness (+) left LE, heaviness All other systems reviewed and are negative.  EKGs/Labs/Other Studies Reviewed:    The following studies were reviewed today:  Lexiscan 03/03/2020:  The left ventricular ejection fraction is normal (55-65%). Nuclear stress EF: 61%. There was no ST segment deviation noted during stress. There is a small defect of mild severity present in the apical inferior location. The defect is non-reversible and consistent with diaphragmatic attenuation artifact and extracardiac gut uptake. No ischemia noted. This is a low risk study.   Calcium Scoring 01/09/2020  Coronary calcium score of 1173. This was 87th percentile for age and sex matched control.   EKG:   04/15/2021: not obtained. 01/11/2021:  SR 1st deg AVB, iRBBB  Recent Labs: 02/23/2021: ALT 31; BUN 23; Creatinine, Ser 1.20; Hemoglobin 13.5; Platelets 224.0; Potassium 4.6; Sodium 142; TSH 4.58  Recent Lipid Panel    Component Value Date/Time   CHOL 181 02/23/2021 1130   CHOL 183 05/11/2020 1219   TRIG 38.0 02/23/2021 1130   HDL 110.70 02/23/2021 1130   HDL 128 05/11/2020 1219   CHOLHDL 2 02/23/2021 1130   VLDL 7.6 02/23/2021 1130   LDLCALC 63 02/23/2021 1130   LDLCALC 46 05/11/2020 1219   LDLDIRECT 131.6 03/15/2012 0754    Physical Exam:    VS:  BP 126/70 (BP Location: Left Arm, Patient Position: Sitting, Cuff Size: Normal)   Pulse 72   Ht '6\' 2"'$  (1.88 m)   Wt 183 lb (83 kg)   BMI 23.50 kg/m     Wt Readings from Last 5 Encounters:  04/15/21 183 lb (83 kg)  02/23/21 177 lb 12.8 oz (80.6 kg)  02/09/21 177 lb 9.6 oz (80.6 kg)  01/11/21 179 lb 3.2 oz (81.3 kg)  01/04/21 186 lb 9.6 oz (84.6 kg)    Constitutional: No acute distress Eyes: sclera non-icteric, normal conjunctiva and  lids ENMT: normal dentition, moist mucous membranes Cardiovascular: regular rhythm, normal rate, no murmur. S1 and S2 normal. No jugular venous distention.  Respiratory: clear to auscultation bilaterally GI : normal bowel sounds, soft and nontender. No distention.   MSK: extremities warm, well perfused. No edema.  NEURO: grossly nonfocal exam, moves all extremities. PSYCH: alert and oriented x 3, normal mood and affect.   ASSESSMENT:    1. Dizziness   2. Syncope and collapse   3. Orthostatic hypotension   4. Dyslipidemia   5. High serum high density lipoprotein (HDL)  6. Agatston coronary artery calcium score greater than 400   7. Essential hypertension     PLAN:    Dizziness -  Syncope and collapse Orthostatic hypotension - Suspect this is related to venous pooling from significant venous varicosities bilaterally.  Recommend wearing compression stockings, adequate hydration.  Recommended very intentional rise from a lying position to a seated position, and then seated position to a standing position.  This should take several minutes, so that blood pressure may equilibrate.  Recommended adequate hydration. -His BP has improved with stopping losartan, but not the dizziness. We will see how he feels after cataract surgery. - consider neurologist referral if dizziness does not improve.  Coronary artery calcifications Dyslipidemia High HDL - continue taking atorvastatin 40 mg daily and ASA 81 mg daily. CAC score 1173, no ischemia on nuc.   Essential hypertension -stable off of losartan, observe. BP normal today.    Total time of encounter: 30 minutes total time of encounter, including 20 minutes spent in face-to-face patient care on the date of this encounter. This time includes coordination of care and counseling regarding above mentioned problem list. Remainder of non-face-to-face time involved reviewing chart documents/testing relevant to the patient encounter and  documentation in the medical record. I have independently reviewed documentation from referring provider.   Follow-up in 1 year.  Cherlynn Kaiser, MD, [facility]    Medication Adjustments/Labs and Tests Ordered: Current medicines are reviewed at length with the patient today.  Concerns regarding medicines are outlined above.   No orders of the defined types were placed in this encounter.   No orders of the defined types were placed in this encounter.   Patient Instructions  Medication Instructions:  No Changes In Medications at this time.  *If you need a refill on your cardiac medications before your next appointment, please call your pharmacy*  Follow-Up: At [facility], you and your health needs are our priority.  As part of our continuing mission to provide you with exceptional heart care, we have created designated Provider Care Teams.  These Care Teams include your primary Cardiologist (physician) and Advanced Practice Providers (APPs -  Physician Assistants and Nurse Practitioners) who all work together to provide you with the care you need, when you need it.   Your next appointment:   1 year(s)  The format for your next appointment:   In Person  Provider:   Cherlynn Kaiser, MD    I,Zite Okoli,acting as a scribe for Elouise Munroe, MD.,have documented all relevant documentation on the behalf of Elouise Munroe, MD,as directed by  Elouise Munroe, MD while in the presence of Elouise Munroe, MD.   I, Elouise Munroe, MD, have reviewed all documentation for this visit. The documentation on today's date of service for the exam, diagnosis, procedures, and orders are all accurate and complete.

## 2021-04-15 NOTE — Patient Instructions (Signed)

## 2021-05-11 DIAGNOSIS — H18413 Arcus senilis, bilateral: Secondary | ICD-10-CM | POA: Diagnosis not present

## 2021-05-11 DIAGNOSIS — H2511 Age-related nuclear cataract, right eye: Secondary | ICD-10-CM | POA: Diagnosis not present

## 2021-05-11 DIAGNOSIS — H25013 Cortical age-related cataract, bilateral: Secondary | ICD-10-CM | POA: Diagnosis not present

## 2021-05-11 DIAGNOSIS — H25043 Posterior subcapsular polar age-related cataract, bilateral: Secondary | ICD-10-CM | POA: Diagnosis not present

## 2021-05-11 DIAGNOSIS — H35372 Puckering of macula, left eye: Secondary | ICD-10-CM | POA: Diagnosis not present

## 2021-05-11 DIAGNOSIS — H2513 Age-related nuclear cataract, bilateral: Secondary | ICD-10-CM | POA: Diagnosis not present

## 2021-06-22 MED ORDER — INDOMETHACIN 50 MG PO CAPS: 50.0000 mg | ORAL_CAPSULE | Freq: Three times a day (TID) | ORAL | 3 refills | Status: AC | PRN

## 2021-06-28 DIAGNOSIS — H2511 Age-related nuclear cataract, right eye: Secondary | ICD-10-CM | POA: Diagnosis not present

## 2021-06-29 DIAGNOSIS — H2512 Age-related nuclear cataract, left eye: Secondary | ICD-10-CM | POA: Diagnosis not present

## 2021-07-19 DIAGNOSIS — H2512 Age-related nuclear cataract, left eye: Secondary | ICD-10-CM | POA: Diagnosis not present

## 2021-12-02 DIAGNOSIS — H02403 Unspecified ptosis of bilateral eyelids: Secondary | ICD-10-CM | POA: Diagnosis not present

## 2021-12-02 DIAGNOSIS — H02132 Senile ectropion of right lower eyelid: Secondary | ICD-10-CM | POA: Diagnosis not present

## 2021-12-02 DIAGNOSIS — H02135 Senile ectropion of left lower eyelid: Secondary | ICD-10-CM | POA: Diagnosis not present

## 2021-12-16 DIAGNOSIS — H02135 Senile ectropion of left lower eyelid: Secondary | ICD-10-CM | POA: Diagnosis not present

## 2021-12-16 DIAGNOSIS — D2210 Melanocytic nevi of unspecified eyelid, including canthus: Secondary | ICD-10-CM | POA: Diagnosis not present

## 2021-12-16 DIAGNOSIS — H02132 Senile ectropion of right lower eyelid: Secondary | ICD-10-CM | POA: Diagnosis not present

## 2021-12-28 DIAGNOSIS — H02834 Dermatochalasis of left upper eyelid: Secondary | ICD-10-CM | POA: Diagnosis not present

## 2021-12-28 DIAGNOSIS — H02831 Dermatochalasis of right upper eyelid: Secondary | ICD-10-CM | POA: Diagnosis not present

## 2022-02-10 ENCOUNTER — Ambulatory Visit (INDEPENDENT_AMBULATORY_CARE_PROVIDER_SITE_OTHER): Payer: Medicare Other

## 2022-02-10 DIAGNOSIS — Z0000 Encounter for general adult medical examination without abnormal findings: Secondary | ICD-10-CM

## 2022-02-10 NOTE — Progress Notes (Addendum)
I connected with George Fitzgerald today by telephone and verified that I am speaking with the correct person using two identifiers. Location patient: home Location provider: work Persons participating in the virtual visit: patient, provider.   I discussed the limitations, risks, security and privacy concerns of performing an evaluation and management service by telephone and the availability of in person appointments. I also discussed with the patient that there may be a patient responsible charge related to this service. The patient expressed understanding and verbally consented to this telephonic visit.    Interactive audio and video telecommunications were attempted between this provider and patient, however failed, due to patient having technical difficulties OR patient did not have access to video capability.  We continued and completed visit with audio only.  Some vital signs may be absent or patient reported.   Time Spent with patient on telephone encounter: 30 minutes  Subjective:   George Fitzgerald is a 71 y.o. male who presents for Medicare Annual/Subsequent preventive examination.  Review of Systems     Cardiac Risk Factors include: advanced age (>54mn, >>64women);dyslipidemia;family history of premature cardiovascular disease;male gender     Objective:    There were no vitals filed for this visit. There is no height or weight on file to calculate BMI.     02/10/2022    2:32 PM 02/09/2021   12:05 PM 07/28/2011    7:00 AM 07/25/2011    3:03 PM  Advanced Directives  Does Patient Have a Medical Advance Directive? No Yes  Patient does not have advance directive  Type of Advance Directive  Living will;Healthcare Power of Attorney    Does patient want to make changes to medical advance directive?  No - Patient declined    Copy of [place] Chart?  No - copy requested    Would patient like information on creating a medical advance directive? No - Patient declined      Pre-existing out of facility DNR order (yellow form or pink MOST form)   No     Current Medications (verified) Outpatient Encounter Medications as of 02/10/2022  Medication Sig   allopurinol (ZYLOPRIM) 300 MG tablet TAKE ONE TABLET BY MOUTH DAILY   Ascorbic Acid (SUPER C COMPLEX PO) Take 2 tablets by mouth daily.   aspirin EC 81 MG tablet Take 81 mg by mouth daily. Swallow whole.   atorvastatin (LIPITOR) 40 MG tablet TAKE ONE TABLET BY MOUTH DAILY   B Complex-C (SUPER B COMPLEX PO) Take 2 tablets by mouth daily.   Cholecalciferol (VITAMIN D3) 25 MCG (1000 UT) CAPS Take 3,000 Units by mouth daily.   colchicine 0.6 MG tablet Take 1 tablet (0.6 mg total) by mouth 4 (four) times daily as needed.   Cyanocobalamin (VITAMIN B-12) 1000 MCG SUBL Place 1 tablet (1,000 mcg total) under the tongue daily.   indomethacin (INDOCIN) 50 MG capsule Take 1 capsule (50 mg total) by mouth 3 (three) times daily as needed.   omeprazole (PRILOSEC) 20 MG capsule Take 20 mg by mouth daily as needed.    vitamin E 180 MG (400 UNITS) capsule Take 800 Units by mouth daily.   [DISCONTINUED] hydroxychloroquine (PLAQUENIL) 200 MG tablet Take 200 mg by mouth 2 (two) times daily. Use as needed when traveling   No facility-administered encounter medications on file as of 02/10/2022.    Allergies (verified) Patient has no known allergies.   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   Varicose veins    L   Past Surgical History:  Procedure Laterality Date   COLONOSCOPY     MASS EXCISION  07/28/2011   Procedure: EXCISION MASS/ left foot;  Surgeon: Wylene Simmer, MD;  Location: [place];  Service: Orthopedics;  Laterality: Left;  left foot ganglion cyst excision   VARICOSE VEIN SURGERY  2011   left leg    Family History  Problem Relation Age of Onset   Stroke Mother 59   Nephritis Father 62   Kidney disease Father 58       nephritis   Social History   Socioeconomic History   Marital status: Married    Spouse name: Not on file   Number of children: 1   Years of education: Not on file   Highest education level: Not on file  Occupational History   Occupation: retired  Tobacco Use   Smoking status: Never   Smokeless tobacco: Never  Substance and Sexual Activity   Alcohol use: Yes    Alcohol/week: 14.0 standard drinks    Types: 14 Glasses of wine per week   Drug use: No   Sexual activity: Yes  Other Topics Concern   Not on file  Social History Narrative   Regular exercise-yes,sailor   Social Determinants of Health   Financial Resource Strain: Low Risk    Difficulty of Paying Living Expenses: Not hard at all  Food Insecurity: No Food Insecurity   Worried About Charity fundraiser in the Last Year: Never true   [place] in the Last Year: Never true  Transportation Needs: No Transportation Needs   Lack of Transportation (Medical): No   Lack of Transportation (Non-Medical): No  Physical Activity: Sufficiently Active   Days of Exercise per Week: 7 days   Minutes of Exercise per Session: 30 min  Stress: No Stress Concern Present   Feeling of Stress : Not at all  Social Connections: Socially Integrated   Frequency of Communication with Friends and Family: More than three times a week   Frequency of Social Gatherings with Friends and Family: More than three times a week   Attends Religious Services: More than 4 times per year   Active Member of [employer] or Organizations: Not on file   Attends Music therapist: More than 4 times per year   Marital Status: Married    Tobacco Counseling Counseling given: Not Answered   Clinical Intake:  Pre-visit preparation completed: Yes  Pain : No/denies pain     BMI - recorded: 23.5 Nutritional Status: BMI  of 19-24  Normal Nutritional Risks: None Diabetes: No  How often do you need to have someone help you when you read instructions, pamphlets, or other written materials from your doctor or pharmacy?: 1 - Never What is the last grade level you completed in school?: College Degree  Diabetic? no  Interpreter Needed?: No  Information entered by :: Lisette Abu, LPN   Activities of Daily Living    02/10/2022    2:49 PM  In your present state of health, do you have any difficulty performing the following activities:  Hearing? 0  Vision? 0  Difficulty concentrating or making decisions? 0  Walking or climbing stairs? 0  Dressing or bathing? 0  Doing  errands, shopping? 0  Preparing Food and eating ? N  Using the Toilet? N  In the past six months, have you accidently leaked urine? N  Do you have problems with loss of bowel control? N  Managing your Medications? N  Managing your Finances? N  Housekeeping or managing your Housekeeping? N    Patient Care Team: Plotnikov, Evie Lacks, MD as PCP - General Ladene Artist, MD (Gastroenterology) Delsa Sale, OD as Consulting Physician (Optometry) Elouise Munroe, MD as Consulting Physician (Cardiology)  Indicate any recent Medical Services you may have received from other than [facility] providers in the past year (date may be approximate).     Assessment:   This is a routine wellness examination for Jordin.  Hearing/Vision screen Hearing Screening - Comments:: Patient denied any hearing difficulty.   No hearing aids.  Vision Screening - Comments:: Patient does not wear any corrective lenses/contacts.   Eye exam done by: Teodoro Spray, OD.   Dietary issues and exercise activities discussed: Current Exercise Habits: Structured exercise class;Home exercise routine, Type of exercise: walking;treadmill;stretching;strength training/weights;Other - see comments (swimming), Time (Minutes): 60, Frequency (Times/Week): 7, Weekly  Exercise (Minutes/Week): 420, Intensity: Moderate   Goals Addressed             This Visit's Progress    My goal is to stay well and healthy.        Depression Screen    02/10/2022    2:47 PM 02/09/2021   12:00 PM 01/04/2020   10:45 PM  PHQ 2/9 Scores  PHQ - 2 Score 0 0 0    Fall Risk    02/10/2022    2:33 PM 02/09/2021   12:05 PM 01/04/2021    2:48 PM 01/04/2020   10:45 PM 12/26/2019    3:22 PM  Fall Risk   Falls in the past year? 0 1   0  Number falls in past yr: 0 0 0 0   Injury with Fall? 0 1 0 0   Risk for fall due to : No Fall Risks Other (Comment) Impaired balance/gait    Risk for fall due to: Comment  syncope & collapse pt states he tends to get dizzy when he gets up. Been going on now for abt a year, and his cardiologist is aware    Follow up Falls evaluation completed Falls evaluation completed   Falls evaluation completed    [place]:  Any stairs in or around the home? Yes  If so, are there any without handrails? No  Home free of loose throw rugs in walkways, pet beds, electrical cords, etc? Yes  Adequate lighting in your home to reduce risk of falls? Yes   ASSISTIVE DEVICES UTILIZED TO PREVENT FALLS:  Life alert? No  Use of a cane, walker or w/c? No  Grab bars in the bathroom? Yes  Shower chair or bench in shower? Yes  Elevated toilet seat or a handicapped toilet? No   TIMED UP AND GO:  Was the test performed? No .  Length of time to ambulate 10 feet: n/a sec.   Appearance of gait: Patient not evaluated for gait during this visit.  Cognitive Function:        02/10/2022    2:50 PM  6CIT Screen  What Year? 0 points  What month? 0 points  What time? 0 points  Count back from 20 0 points  Months in reverse 0 points  Repeat phrase 0 points  Total Score 0 points    Immunizations Immunization History  Administered Date(s) Administered   Influenza Split 06/29/2011   Influenza,inj,Quad PF,6+ Mos 08/20/2013,  05/20/2015   Influenza-Unspecified 08/04/2016   Moderna Sars-Covid-2 Vaccination 11/02/2019, 11/30/2019, 05/22/2020   Pneumococcal Conjugate-13 02/23/2021   Pneumococcal Polysaccharide-23 05/20/2015   Tdap 02/23/2011, 02/23/2021    TDAP status: Up to date  Flu Vaccine status: Up to date  Pneumococcal vaccine status: Up to date  Covid-19 vaccine status: Completed vaccines  Qualifies for Shingles Vaccine? Yes   Zostavax completed No   Shingrix Completed?: No.    Education has been provided regarding the importance of this vaccine. Patient has been advised to call insurance company to determine out of pocket expense if they have not yet received this vaccine. Advised may also receive vaccine at local pharmacy or Health Dept. Verbalized acceptance and understanding.  Screening Tests Health Maintenance  Topic Date Due   Zoster Vaccines- Shingrix (1 of 2) Never done   COVID-19 Vaccine (4 - Booster for Moderna series) 07/17/2020   INFLUENZA VACCINE  04/19/2022   COLONOSCOPY (Pts 45-59yr Insurance coverage will need to be confirmed)  04/02/2029   TETANUS/TDAP  02/24/2031   Pneumonia Vaccine 71 Years old  Completed   Hepatitis C Screening  Completed   HPV VACCINES  Aged Out    Health Maintenance  Health Maintenance Due  Topic Date Due   Zoster Vaccines- Shingrix (1 of 2) Never done   COVID-19 Vaccine (4 - Booster for Moderna series) 07/17/2020    Colorectal cancer screening: Type of screening: Colonoscopy. Completed 04/03/2019. Repeat every 10 years  Lung Cancer Screening: (Low Dose CT Chest recommended if Age 73-80years, 30 pack-year currently smoking OR have quit w/in 15years.) does not qualify.   Lung Cancer Screening Referral: no  Additional Screening:  Hepatitis C Screening: does qualify; Completed 05/11/2016  Vision Screening: Recommended annual ophthalmology exams for early detection of glaucoma and other disorders of the eye. Is the patient up to date with their  annual eye exam?  Yes  Who is the provider or what is the name of the office in which the patient attends annual eye exams? STeodoro Spray OD. If pt is not established with a provider, would they like to be referred to a provider to establish care? No .   Dental Screening: Recommended annual dental exams for proper oral hygiene  Community Resource Referral / Chronic Care Management: CRR required this visit?  No   CCM required this visit?  No      Plan:     I have personally reviewed and noted the following in the patient's chart:   Medical and social history Use of alcohol, tobacco or illicit drugs  Current medications and supplements including opioid prescriptions. Patient is not currently taking opioid prescriptions. Functional ability and status Nutritional status Physical activity Advanced directives List of other physicians Hospitalizations, surgeries, and ER visits in previous 12 months Vitals Screenings to include cognitive, depression, and falls Referrals and appointments  In addition, I have reviewed and discussed with patient certain preventive protocols, quality metrics, and best practice recommendations. A written personalized care plan for preventive services as well as general preventive health recommendations were provided to patient.     SSheral Flow LPN   [id number]  Nurse Notes:  There were no vitals filed for this visit. There is no height or weight on file to calculate BMI. Patient stated that he has no issues with gait or balance; does  not use any assistive devices.  Medical screening examination/treatment/procedure(s) were performed by non-physician practitioner and as supervising physician I was immediately available for consultation/collaboration.  I agree with above. Lew Dawes, MD

## 2022-02-10 NOTE — Patient Instructions (Signed)
George Fitzgerald , Thank you for taking time to come for your Medicare Wellness Visit. I appreciate your ongoing commitment to your health goals. Please review the following plan we discussed and let me know if I can assist you in the future.   Screening recommendations/referrals: Colonoscopy: 04/03/2019; due every 10 years Recommended yearly ophthalmology/optometry visit for glaucoma screening and checkup Recommended yearly dental visit for hygiene and checkup  Vaccinations: Influenza vaccine: 06/2021 Pneumococcal vaccine: 05/20/2015, 02/23/2021 Tdap vaccine: 02/23/2021; due every 10 years Shingles vaccine: never done   Covid-19: 11/02/2019, 11/30/2019, 05/22/2020  Advanced directives: Yes; Please bring a copy of your health care power of attorney and living will to the office at your convenience.  Conditions/risks identified: Yes  Next appointment: Please schedule your next Medicare Wellness Visit with your Nurse Health Advisor in 1 year by calling [phone].  Preventive Care 73 Years and Older, Male Preventive care refers to lifestyle choices and visits with your health care provider that can promote health and wellness. What does preventive care include? A yearly physical exam. This is also called an annual well check. Dental exams once or twice a year. Routine eye exams. Ask your health care provider how often you should have your eyes checked. Personal lifestyle choices, including: Daily care of your teeth and gums. Regular physical activity. Eating a healthy diet. Avoiding tobacco and drug use. Limiting alcohol use. Practicing safe sex. Taking low doses of aspirin every day. Taking vitamin and mineral supplements as recommended by your health care provider. What happens during an annual well check? The services and screenings done by your health care provider during your annual well check will depend on your age, overall health, lifestyle risk factors, and family history of  disease. Counseling  Your health care provider may ask you questions about your: Alcohol use. Tobacco use. Drug use. Emotional well-being. Home and relationship well-being. Sexual activity. Eating habits. History of falls. Memory and ability to understand (cognition). Work and work Statistician. Screening  You may have the following tests or measurements: Height, weight, and BMI. Blood pressure. Lipid and cholesterol levels. These may be checked every 5 years, or more frequently if you are over 73 years old. Skin check. Lung cancer screening. You may have this screening every year starting at age 73 if you have a 30-pack-year history of smoking and currently smoke or have quit within the past 15 years. Fecal occult blood test (FOBT) of the stool. You may have this test every year starting at age 73. Flexible sigmoidoscopy or colonoscopy. You may have a sigmoidoscopy every 5 years or a colonoscopy every 10 years starting at age 73. Prostate cancer screening. Recommendations will vary depending on your family history and other risks. Hepatitis C blood test. Hepatitis B blood test. Sexually transmitted disease (STD) testing. Diabetes screening. This is done by checking your blood sugar (glucose) after you have not eaten for a while (fasting). You may have this done every 1-3 years. Abdominal aortic aneurysm (AAA) screening. You may need this if you are a current or former smoker. Osteoporosis. You may be screened starting at age 73 if you are at high risk. Talk with your health care provider about your test results, treatment options, and if necessary, the need for more tests. Vaccines  Your health care provider may recommend certain vaccines, such as: Influenza vaccine. This is recommended every year. Tetanus, diphtheria, and acellular pertussis (Tdap, Td) vaccine. You may need a Td booster every 10 years. Zoster vaccine. You may need  this after age 73. Pneumococcal 13-valent  conjugate (PCV13) vaccine. One dose is recommended after age 73. Pneumococcal polysaccharide (PPSV23) vaccine. One dose is recommended after age 73. Talk to your health care provider about which screenings and vaccines you need and how often you need them. This information is not intended to replace advice given to you by your health care provider. Make sure you discuss any questions you have with your health care provider. Document Released: 10/02/2015 Document Revised: 05/25/2016 Document Reviewed: 07/07/2015 Elsevier Interactive Patient Education  2017 [employer] Prevention in the Home Falls can cause injuries. They can happen to people of all ages. There are many things you can do to make your home safe and to help prevent falls. What can I do on the outside of my home? Regularly fix the edges of walkways and driveways and fix any cracks. Remove anything that might make you trip as you walk through a door, such as a raised step or threshold. Trim any bushes or trees on the path to your home. Use bright outdoor lighting. Clear any walking paths of anything that might make someone trip, such as rocks or tools. Regularly check to see if handrails are loose or broken. Make sure that both sides of any steps have handrails. Any raised decks and porches should have guardrails on the edges. Have any leaves, snow, or ice cleared regularly. Use sand or salt on walking paths during winter. Clean up any spills in your garage right away. This includes oil or grease spills. What can I do in the bathroom? Use night lights. Install grab bars by the toilet and in the tub and shower. Do not use towel bars as grab bars. Use non-skid mats or decals in the tub or shower. If you need to sit down in the shower, use a plastic, non-slip stool. Keep the floor dry. Clean up any water that spills on the floor as soon as it happens. Remove soap buildup in the tub or shower regularly. Attach bath mats  securely with double-sided non-slip rug tape. Do not have throw rugs and other things on the floor that can make you trip. What can I do in the bedroom? Use night lights. Make sure that you have a light by your bed that is easy to reach. Do not use any sheets or blankets that are too big for your bed. They should not hang down onto the floor. Have a firm chair that has side arms. You can use this for support while you get dressed. Do not have throw rugs and other things on the floor that can make you trip. What can I do in the kitchen? Clean up any spills right away. Avoid walking on wet floors. Keep items that you use a lot in easy-to-reach places. If you need to reach something above you, use a strong step stool that has a grab bar. Keep electrical cords out of the way. Do not use floor polish or wax that makes floors slippery. If you must use wax, use non-skid floor wax. Do not have throw rugs and other things on the floor that can make you trip. What can I do with my stairs? Do not leave any items on the stairs. Make sure that there are handrails on both sides of the stairs and use them. Fix handrails that are broken or loose. Make sure that handrails are as long as the stairways. Check any carpeting to make sure that it is firmly attached to  the stairs. Fix any carpet that is loose or worn. Avoid having throw rugs at the top or bottom of the stairs. If you do have throw rugs, attach them to the floor with carpet tape. Make sure that you have a light switch at the top of the stairs and the bottom of the stairs. If you do not have them, ask someone to add them for you. What else can I do to help prevent falls? Wear shoes that: Do not have high heels. Have rubber bottoms. Are comfortable and fit you well. Are closed at the toe. Do not wear sandals. If you use a stepladder: Make sure that it is fully opened. Do not climb a closed stepladder. Make sure that both sides of the stepladder  are locked into place. Ask someone to hold it for you, if possible. Clearly mark and make sure that you can see: Any grab bars or handrails. First and last steps. Where the edge of each step is. Use tools that help you move around (mobility aids) if they are needed. These include: Canes. Walkers. Scooters. Crutches. Turn on the lights when you go into a dark area. Replace any light bulbs as soon as they burn out. Set up your furniture so you have a clear path. Avoid moving your furniture around. If any of your floors are uneven, fix them. If there are any pets around you, be aware of where they are. Review your medicines with your doctor. Some medicines can make you feel dizzy. This can increase your chance of falling. Ask your doctor what other things that you can do to help prevent falls. This information is not intended to replace advice given to you by your health care provider. Make sure you discuss any questions you have with your health care provider. Document Released: 07/02/2009 Document Revised: 02/11/2016 Document Reviewed: 10/10/2014 Elsevier Interactive Patient Education  2017 [employer].

## 2022-02-24 ENCOUNTER — Other Ambulatory Visit: Payer: Self-pay | Admitting: Internal Medicine

## 2022-03-03 DIAGNOSIS — H02834 Dermatochalasis of left upper eyelid: Secondary | ICD-10-CM | POA: Diagnosis not present

## 2022-03-03 DIAGNOSIS — H02831 Dermatochalasis of right upper eyelid: Secondary | ICD-10-CM | POA: Diagnosis not present

## 2022-03-26 ENCOUNTER — Other Ambulatory Visit: Payer: Self-pay | Admitting: Internal Medicine

## 2022-03-28 ENCOUNTER — Other Ambulatory Visit: Payer: Self-pay | Admitting: Internal Medicine

## 2022-03-30 ENCOUNTER — Encounter: Payer: Self-pay | Admitting: Internal Medicine

## 2022-03-30 MED ORDER — ALLOPURINOL 300 MG PO TABS: 300.0000 mg | ORAL_TABLET | Freq: Every day | ORAL | 0 refills | Status: DC

## 2022-03-30 MED ORDER — ATORVASTATIN CALCIUM 40 MG PO TABS: ORAL_TABLET | ORAL | 0 refills | Status: DC

## 2022-06-26 ENCOUNTER — Other Ambulatory Visit: Payer: Self-pay | Admitting: Internal Medicine

## 2022-07-25 ENCOUNTER — Other Ambulatory Visit: Payer: Self-pay | Admitting: Internal Medicine

## 2022-12-05 ENCOUNTER — Other Ambulatory Visit: Payer: Self-pay | Admitting: *Deleted

## 2022-12-05 MED ORDER — ATORVASTATIN CALCIUM 40 MG PO TABS: ORAL_TABLET | ORAL | 0 refills | Status: DC

## 2022-12-05 MED ORDER — ALLOPURINOL 300 MG PO TABS: ORAL_TABLET | ORAL | 0 refills | Status: DC

## 2022-12-20 ENCOUNTER — Ambulatory Visit (INDEPENDENT_AMBULATORY_CARE_PROVIDER_SITE_OTHER): Payer: Medicare Other | Admitting: Internal Medicine

## 2022-12-20 ENCOUNTER — Encounter: Payer: Self-pay | Admitting: Internal Medicine

## 2022-12-20 VITALS — BP 110/78 | HR 88 | Temp 98.3°F | Ht 74.0 in | Wt 179.0 lb

## 2022-12-20 DIAGNOSIS — I2583 Coronary atherosclerosis due to lipid rich plaque: Secondary | ICD-10-CM

## 2022-12-20 DIAGNOSIS — E559 Vitamin D deficiency, unspecified: Secondary | ICD-10-CM | POA: Diagnosis not present

## 2022-12-20 DIAGNOSIS — E538 Deficiency of other specified B group vitamins: Secondary | ICD-10-CM | POA: Diagnosis not present

## 2022-12-20 DIAGNOSIS — N320 Bladder-neck obstruction: Secondary | ICD-10-CM | POA: Diagnosis not present

## 2022-12-20 DIAGNOSIS — E785 Hyperlipidemia, unspecified: Secondary | ICD-10-CM | POA: Diagnosis not present

## 2022-12-20 DIAGNOSIS — Z0000 Encounter for general adult medical examination without abnormal findings: Secondary | ICD-10-CM | POA: Diagnosis not present

## 2022-12-20 DIAGNOSIS — I2510 Atherosclerotic heart disease of native coronary artery without angina pectoris: Secondary | ICD-10-CM | POA: Diagnosis not present

## 2022-12-20 LAB — CBC WITH DIFFERENTIAL/PLATELET
Basophils Absolute: 0.1 10*3/uL (ref 0.0–0.1)
Basophils Relative: 1.5 % (ref 0.0–3.0)
Eosinophils Absolute: 0.1 10*3/uL (ref 0.0–0.7)
Eosinophils Relative: 1.6 % (ref 0.0–5.0)
HCT: 41 % (ref 39.0–52.0)
Hemoglobin: 14.1 g/dL (ref 13.0–17.0)
Lymphocytes Relative: 24.3 % (ref 12.0–46.0)
Lymphs Abs: 1.3 10*3/uL (ref 0.7–4.0)
MCHC: 34.3 g/dL (ref 30.0–36.0)
MCV: 104.4 fl — ABNORMAL HIGH (ref 78.0–100.0)
Monocytes Absolute: 0.7 10*3/uL (ref 0.1–1.0)
Monocytes Relative: 12.7 % — ABNORMAL HIGH (ref 3.0–12.0)
Neutro Abs: 3.3 10*3/uL (ref 1.4–7.7)
Neutrophils Relative %: 59.9 % (ref 43.0–77.0)
Platelets: 177 10*3/uL (ref 150.0–400.0)
RBC: 3.93 Mil/uL — ABNORMAL LOW (ref 4.22–5.81)
RDW: 13.9 % (ref 11.5–15.5)
WBC: 5.5 10*3/uL (ref 4.0–10.5)

## 2022-12-20 LAB — URINALYSIS
Bilirubin Urine: NEGATIVE
Hgb urine dipstick: NEGATIVE
Ketones, ur: NEGATIVE
Leukocytes,Ua: NEGATIVE
Nitrite: NEGATIVE
Specific Gravity, Urine: 1.015 (ref 1.000–1.030)
Total Protein, Urine: NEGATIVE
Urine Glucose: NEGATIVE
Urobilinogen, UA: 0.2 (ref 0.0–1.0)
pH: 6 (ref 5.0–8.0)

## 2022-12-20 LAB — TSH: TSH: 5.17 u[IU]/mL (ref 0.35–5.50)

## 2022-12-20 LAB — COMPREHENSIVE METABOLIC PANEL
ALT: 33 U/L (ref 0–53)
AST: 44 U/L — ABNORMAL HIGH (ref 0–37)
Albumin: 5 g/dL (ref 3.5–5.2)
Alkaline Phosphatase: 106 U/L (ref 39–117)
BUN: 23 mg/dL (ref 6–23)
CO2: 26 mEq/L (ref 19–32)
Calcium: 9.8 mg/dL (ref 8.4–10.5)
Chloride: 102 mEq/L (ref 96–112)
Creatinine, Ser: 1.29 mg/dL (ref 0.40–1.50)
GFR: 54.88 mL/min — ABNORMAL LOW (ref >60.00)
Glucose, Bld: 93 mg/dL (ref 70–99)
Potassium: 4.6 mEq/L (ref 3.5–5.1)
Sodium: 136 mEq/L (ref 135–145)
Total Bilirubin: 1 mg/dL (ref 0.2–1.2)
Total Protein: 7.5 g/dL (ref 6.0–8.3)

## 2022-12-20 LAB — LIPID PANEL
Cholesterol: 203 mg/dL — ABNORMAL HIGH (ref 0–200)
HDL: 125.4 mg/dL (ref >39.00)
LDL Cholesterol: 69 mg/dL (ref 0–99)
NonHDL: 77.61
Total CHOL/HDL Ratio: 2
Triglycerides: 45 mg/dL (ref 0.0–149.0)
VLDL: 9 mg/dL (ref 0.0–40.0)

## 2022-12-20 LAB — PSA: PSA: 1.57 ng/mL (ref 0.10–4.00)

## 2022-12-20 LAB — VITAMIN D 25 HYDROXY (VIT D DEFICIENCY, FRACTURES): VITD: 44.84 ng/mL (ref 30.00–100.00)

## 2022-12-20 LAB — VITAMIN B12: Vitamin B-12: >1500 pg/mL — ABNORMAL HIGH (ref 211–911)

## 2022-12-20 MED ORDER — ALLOPURINOL 300 MG PO TABS: ORAL_TABLET | ORAL | 3 refills | Status: DC

## 2022-12-20 MED ORDER — ATORVASTATIN CALCIUM 40 MG PO TABS: ORAL_TABLET | ORAL | 3 refills | Status: DC

## 2022-12-20 NOTE — Assessment & Plan Note (Signed)
On B12 

## 2022-12-20 NOTE — Assessment & Plan Note (Signed)
Dr Margaretann Loveless ASA, Lipitor

## 2022-12-20 NOTE — Progress Notes (Signed)
Subjective:  Patient ID: George Fitzgerald, male    DOB: [birth year]  Age: 72 y.o. MRN: [id number]  CC: Annual Exam   HPI George Fitzgerald presents for a well exam. F/u CAD, gout One dog is dying  Outpatient Medications Prior to Visit  Medication Sig Dispense Refill   Ascorbic Acid (SUPER C COMPLEX PO) Take 2 tablets by mouth daily.     aspirin EC 81 MG tablet Take 81 mg by mouth daily. Swallow whole.     B Complex-C (SUPER B COMPLEX PO) Take 2 tablets by mouth daily.     Cholecalciferol (VITAMIN D3) 25 MCG (1000 UT) CAPS Take 3,000 Units by mouth daily.     Cyanocobalamin (VITAMIN B-12) 1000 MCG SUBL Place 1 tablet (1,000 mcg total) under the tongue daily. 100 tablet 3   indomethacin (INDOCIN) 50 MG capsule Take 1 capsule (50 mg total) by mouth 3 (three) times daily as needed. 60 capsule 3   vitamin E 180 MG (400 UNITS) capsule Take 800 Units by mouth daily.     allopurinol (ZYLOPRIM) 300 MG tablet TAKE 1 TABLET BY MOUTH DAILY Keep 12/20/22 appt for future refills 30 tablet 0   atorvastatin (LIPITOR) 40 MG tablet TAKE 1 TABLET BY MOUTH DAILY Keep 12/20/22 appt for future refills 30 tablet 0   colchicine 0.6 MG tablet Take 1 tablet (0.6 mg total) by mouth 4 (four) times daily as needed. 30 tablet 5   omeprazole (PRILOSEC) 20 MG capsule Take 20 mg by mouth daily as needed.      No facility-administered medications prior to visit.    ROS: Review of Systems  Constitutional:  Negative for appetite change, fatigue and unexpected weight change.  HENT:  Negative for congestion, nosebleeds, sneezing, sore throat and trouble swallowing.   Eyes:  Negative for itching and visual disturbance.  Respiratory:  Negative for cough.   Cardiovascular:  Negative for chest pain, palpitations and leg swelling.  Gastrointestinal:  Negative for abdominal distention, blood in stool, diarrhea and nausea.  Genitourinary:  Negative for frequency and hematuria.  Musculoskeletal:  Negative for back pain, gait problem, joint  swelling and neck pain.  Skin:  Negative for rash.  Neurological:  Negative for dizziness, tremors, speech difficulty and weakness.  Psychiatric/Behavioral:  Negative for agitation, dysphoric mood and sleep disturbance. The patient is not nervous/anxious.     Objective:  BP 110/78 (BP Location: Left Arm, Patient Position: Sitting, Cuff Size: Large)   Pulse 88   Temp 98.3 F (36.8 C) (Oral)   Ht 6\' 2"  (1.88 m)   Wt 179 lb (81.2 kg)   SpO2 97%   BMI 22.98 kg/m   BP Readings from Last 3 Encounters:  12/20/22 110/78  04/15/21 126/70  02/23/21 140/78    Wt Readings from Last 3 Encounters:  12/20/22 179 lb (81.2 kg)  04/15/21 183 lb (83 kg)  02/23/21 177 lb 12.8 oz (80.6 kg)    Physical Exam Constitutional:      General: He is not in acute distress.    Appearance: He is well-developed.     Comments: NAD  Eyes:     Conjunctiva/sclera: Conjunctivae normal.     Pupils: Pupils are equal, round, and reactive to light.  Neck:     Thyroid: No thyromegaly.     Vascular: No JVD.  Cardiovascular:     Rate and Rhythm: Normal rate and regular rhythm.     Heart sounds: Normal heart sounds. No murmur heard.    No  friction rub. No gallop.  Pulmonary:     Effort: Pulmonary effort is normal. No respiratory distress.     Breath sounds: Normal breath sounds. No wheezing or rales.  Chest:     Chest wall: No tenderness.  Abdominal:     General: Bowel sounds are normal. There is no distension.     Palpations: Abdomen is soft. There is no mass.     Tenderness: There is no abdominal tenderness. There is no guarding or rebound.  Musculoskeletal:        General: No tenderness. Normal range of motion.     Cervical back: Normal range of motion.  Lymphadenopathy:     Cervical: No cervical adenopathy.  Skin:    General: Skin is warm and dry.     Findings: No rash.  Neurological:     Mental Status: He is alert and oriented to person, place, and time.     Cranial Nerves: No cranial nerve  deficit.     Motor: No abnormal muscle tone.     Coordination: Coordination normal.     Gait: Gait normal.     Deep Tendon Reflexes: Reflexes are normal and symmetric.  Psychiatric:        Behavior: Behavior normal.        Thought Content: Thought content normal.        Judgment: Judgment normal.   Pt declined rectal exam  Lab Results  Component Value Date   WBC 5.3 02/23/2021   HGB 13.5 02/23/2021   HCT 39.6 02/23/2021   PLT 224.0 02/23/2021   GLUCOSE 103 (H) 02/23/2021   CHOL 181 02/23/2021   TRIG 38.0 02/23/2021   HDL 110.70 02/23/2021   LDLDIRECT 131.6 03/15/2012   LDLCALC 63 02/23/2021   ALT 31 02/23/2021   AST 37 02/23/2021   NA 142 02/23/2021   K 4.6 02/23/2021   CL 105 02/23/2021   CREATININE 1.20 02/23/2021   BUN 23 02/23/2021   CO2 23 02/23/2021   TSH 4.58 (H) 02/23/2021   PSA 1.17 02/23/2021   HGBA1C 5.6 08/28/2018    CT CARDIAC SCORING  Addendum Date: 01/10/2020   ADDENDUM REPORT: 01/10/2020 11:22 CLINICAL DATA:  Risk stratification, CAD monitoring, dyslipidemia EXAM: Coronary Calcium Score TECHNIQUE: The patient was scanned on a [employer] scanner. Axial non-contrast 3 mm slices were carried out through the heart. The data set was analyzed on a dedicated work station and scored using the [place]. FINDINGS: Non-cardiac: See separate report from [facility]. Ascending Aorta: 37 mm at mid ascending aorta measured in an axial plane. Pericardium: Normal, trace pericardial fluid. Coronary arteries: Normal coronary origins. IMPRESSION: Coronary calcium score of 1173. This was 87th percentile for age and sex matched control. Electronically Signed   By: Cherlynn Kaiser   On: 01/10/2020 11:22   Result Date: 01/10/2020 EXAM: OVER-READ INTERPRETATION  CT CHEST The following report is an over-read performed by radiologist Dr. Vinnie Langton of [facility], [place] on 01/09/2020. This over-read does not include interpretation of cardiac or coronary  anatomy or pathology. The coronary calcium score interpretation by the cardiologist is attached. COMPARISON:  None. FINDINGS: Within the visualized portions of the thorax there are no suspicious appearing pulmonary nodules or masses, there is no acute consolidative airspace disease, no pleural effusions, no pneumothorax and no lymphadenopathy. Visualized portions of the upper abdomen demonstrates mild diffuse low attenuation throughout the visualized hepatic parenchyma. There are no aggressive appearing lytic or blastic lesions noted in the visualized portions of  the skeleton. IMPRESSION: 1. Mild diffuse hepatic steatosis. Electronically Signed: By: Vinnie Langton M.D. On: 01/09/2020 16:35    Assessment & Plan:   Problem List Items Addressed This Visit       Cardiovascular and Mediastinum   Coronary atherosclerosis    Dr Margaretann Loveless ASA, Lipitor      Relevant Medications   atorvastatin (LIPITOR) 40 MG tablet   Other Relevant Orders   TSH   Urinalysis   CBC with Differential/Platelet   Lipid panel   Comprehensive metabolic panel   VITAMIN D 25 Hydroxy (Vit-D Deficiency, Fractures)   Vitamin B12     Other   Well adult exam    We discussed age appropriate health related issues, including available/recomended screening tests and vaccinations. Labs were ordered to be later reviewed . All questions were answered. We discussed one or more of the following - seat belt use, use of sunscreen/sun exposure exercise, safe sex, fall risk reduction, second hand smoke exposure, firearm use and storage, seat belt use, a need for adhering to healthy diet and exercise. Labs were ordered.  All questions were answered.  Last colon 2020      Relevant Orders   TSH   Urinalysis   CBC with Differential/Platelet   Lipid panel   PSA   Comprehensive metabolic panel   VITAMIN D 25 Hydroxy (Vit-D Deficiency, Fractures)   Vitamin B12   Vitamin D deficiency - Primary    On Vit D      Relevant Orders    VITAMIN D 25 Hydroxy (Vit-D Deficiency, Fractures)   Dyslipidemia   Relevant Medications   atorvastatin (LIPITOR) 40 MG tablet   Other Relevant Orders   TSH   Lipid panel   B12 deficiency    On B12      Relevant Orders   Vitamin B12   Other Visit Diagnoses     Bladder neck obstruction             Meds ordered this encounter  Medications   allopurinol (ZYLOPRIM) 300 MG tablet    Sig: TAKE 1 TABLET BY MOUTH DAILY Keep 12/20/22 appt for future refills    Dispense:  90 tablet    Refill:  3   atorvastatin (LIPITOR) 40 MG tablet    Sig: TAKE 1 TABLET BY MOUTH DAILY Keep 12/20/22 appt for future refills    Dispense:  90 tablet    Refill:  3      Follow-up: Return in about 3 months (around 03/21/2023) for a follow-up visit.  Walker Kehr, MD

## 2022-12-20 NOTE — Assessment & Plan Note (Signed)
We discussed age appropriate health related issues, including available/recomended screening tests and vaccinations. Labs were ordered to be later reviewed . All questions were answered. We discussed one or more of the following - seat belt use, use of sunscreen/sun exposure exercise, safe sex, fall risk reduction, second hand smoke exposure, firearm use and storage, seat belt use, a need for adhering to healthy diet and exercise. Labs were ordered.  All questions were answered.  Last colon 2020

## 2022-12-20 NOTE — Assessment & Plan Note (Signed)
On Vit D 

## 2022-12-20 NOTE — Addendum Note (Signed)
Addended by: Earnstine Regal on: 12/20/2022 02:49 PM   Modules accepted: Orders

## 2022-12-21 ENCOUNTER — Encounter: Payer: Self-pay | Admitting: Internal Medicine

## 2022-12-21 MED ORDER — ATORVASTATIN CALCIUM 40 MG PO TABS: ORAL_TABLET | ORAL | 3 refills | Status: DC

## 2022-12-21 MED ORDER — ALLOPURINOL 300 MG PO TABS: ORAL_TABLET | ORAL | 3 refills | Status: DC

## 2022-12-21 NOTE — Addendum Note (Signed)
Addended by: Earnstine Regal on: 12/21/2022 12:08 PM   Modules accepted: Orders

## 2022-12-22 ENCOUNTER — Encounter: Payer: Self-pay | Admitting: Internal Medicine

## 2023-01-05 ENCOUNTER — Encounter: Payer: Self-pay | Admitting: Internal Medicine

## 2023-02-09 ENCOUNTER — Encounter: Payer: Self-pay | Admitting: Internal Medicine

## 2023-02-23 ENCOUNTER — Ambulatory Visit (INDEPENDENT_AMBULATORY_CARE_PROVIDER_SITE_OTHER): Payer: Medicare Other

## 2023-02-23 VITALS — Ht 74.0 in | Wt 179.0 lb

## 2023-02-23 DIAGNOSIS — Z0000 Encounter for general adult medical examination without abnormal findings: Secondary | ICD-10-CM | POA: Diagnosis not present

## 2023-02-23 NOTE — Patient Instructions (Addendum)
Mr. George Fitzgerald , Thank you for taking time to come for your Medicare Wellness Visit. I appreciate your ongoing commitment to your health goals. Please review the following plan we discussed and let me know if I can assist you in the future.   These are the goals we discussed:  Goals       My goal is to stay well and healthy. (pt-stated)      I want to travel more      Patient Stated (pt-stated)      Get a handle on my dizziness.        This is a list of the screening recommended for you and due dates:  Health Maintenance  Topic Date Due   COVID-19 Vaccine (4 - 2023-24 season) 03/11/2023*   Flu Shot  04/20/2023   Medicare Annual Wellness Visit  02/23/2024   Colon Cancer Screening  04/02/2029   DTaP/Tdap/Td vaccine (3 - Td or Tdap) 02/24/2031   Pneumonia Vaccine  Completed   Hepatitis C Screening  Completed   HPV Vaccine  Aged Out   Zoster (Shingles) Vaccine  Discontinued  *Topic was postponed. The date shown is not the original due date.    Advanced directives: Advance directive discussed with you today. Even though you declined this today, please call our office should you change your mind, and we can give you the proper paperwork for you to fill out.   Conditions/risks identified: None  Next appointment: Follow up in one year for your annual wellness visit.   Preventive Care 35 Years and Older, Male  Preventive care refers to lifestyle choices and visits with your health care provider that can promote health and wellness. What does preventive care include? A yearly physical exam. This is also called an annual well check. Dental exams once or twice a year. Routine eye exams. Ask your health care provider how often you should have your eyes checked. Personal lifestyle choices, including: Daily care of your teeth and gums. Regular physical activity. Eating a healthy diet. Avoiding tobacco and drug use. Limiting alcohol use. Practicing safe sex. Taking low doses of aspirin  every day. Taking vitamin and mineral supplements as recommended by your health care provider. What happens during an annual well check? The services and screenings done by your health care provider during your annual well check will depend on your age, overall health, lifestyle risk factors, and family history of disease. Counseling  Your health care provider may ask you questions about your: Alcohol use. Tobacco use. Drug use. Emotional well-being. Home and relationship well-being. Sexual activity. Eating habits. History of falls. Memory and ability to understand (cognition). Work and work Astronomer. Screening  You may have the following tests or measurements: Height, weight, and BMI. Blood pressure. Lipid and cholesterol levels. These may be checked every 5 years, or more frequently if you are over 38 years old. Skin check. Lung cancer screening. You may have this screening every year starting at age 32 if you have a 30-pack-year history of smoking and currently smoke or have quit within the past 15 years. Fecal occult blood test (FOBT) of the stool. You may have this test every year starting at age 26. Flexible sigmoidoscopy or colonoscopy. You may have a sigmoidoscopy every 5 years or a colonoscopy every 10 years starting at age 30. Prostate cancer screening. Recommendations will vary depending on your family history and other risks. Hepatitis C blood test. Hepatitis B blood test. Sexually transmitted disease (STD) testing. Diabetes screening. This  is done by checking your blood sugar (glucose) after you have not eaten for a while (fasting). You may have this done every 1-3 years. Abdominal aortic aneurysm (AAA) screening. You may need this if you are a current or former smoker. Osteoporosis. You may be screened starting at age 75 if you are at high risk. Talk with your health care provider about your test results, treatment options, and if necessary, the need for more  tests. Vaccines  Your health care provider may recommend certain vaccines, such as: Influenza vaccine. This is recommended every year. Tetanus, diphtheria, and acellular pertussis (Tdap, Td) vaccine. You may need a Td booster every 10 years. Zoster vaccine. You may need this after age 19. Pneumococcal 13-valent conjugate (PCV13) vaccine. One dose is recommended after age 47. Pneumococcal polysaccharide (PPSV23) vaccine. One dose is recommended after age 42. Talk to your health care provider about which screenings and vaccines you need and how often you need them. This information is not intended to replace advice given to you by your health care provider. Make sure you discuss any questions you have with your health care provider. Document Released: 10/02/2015 Document Revised: 05/25/2016 Document Reviewed: 07/07/2015 Elsevier Interactive Patient Education  2017 [employer].  Fall Prevention in the Home Falls can cause injuries. They can happen to people of all ages. There are many things you can do to make your home safe and to help prevent falls. What can I do on the outside of my home? Regularly fix the edges of walkways and driveways and fix any cracks. Remove anything that might make you trip as you walk through a door, such as a raised step or threshold. Trim any bushes or trees on the path to your home. Use bright outdoor lighting. Clear any walking paths of anything that might make someone trip, such as rocks or tools. Regularly check to see if handrails are loose or broken. Make sure that both sides of any steps have handrails. Any raised decks and porches should have guardrails on the edges. Have any leaves, snow, or ice cleared regularly. Use sand or salt on walking paths during winter. Clean up any spills in your garage right away. This includes oil or grease spills. What can I do in the bathroom? Use night lights. Install grab bars by the toilet and in the tub and shower.  Do not use towel bars as grab bars. Use non-skid mats or decals in the tub or shower. If you need to sit down in the shower, use a plastic, non-slip stool. Keep the floor dry. Clean up any water that spills on the floor as soon as it happens. Remove soap buildup in the tub or shower regularly. Attach bath mats securely with double-sided non-slip rug tape. Do not have throw rugs and other things on the floor that can make you trip. What can I do in the bedroom? Use night lights. Make sure that you have a light by your bed that is easy to reach. Do not use any sheets or blankets that are too big for your bed. They should not hang down onto the floor. Have a firm chair that has side arms. You can use this for support while you get dressed. Do not have throw rugs and other things on the floor that can make you trip. What can I do in the kitchen? Clean up any spills right away. Avoid walking on wet floors. Keep items that you use a lot in easy-to-reach places. If you  need to reach something above you, use a strong step stool that has a grab bar. Keep electrical cords out of the way. Do not use floor polish or wax that makes floors slippery. If you must use wax, use non-skid floor wax. Do not have throw rugs and other things on the floor that can make you trip. What can I do with my stairs? Do not leave any items on the stairs. Make sure that there are handrails on both sides of the stairs and use them. Fix handrails that are broken or loose. Make sure that handrails are as long as the stairways. Check any carpeting to make sure that it is firmly attached to the stairs. Fix any carpet that is loose or worn. Avoid having throw rugs at the top or bottom of the stairs. If you do have throw rugs, attach them to the floor with carpet tape. Make sure that you have a light switch at the top of the stairs and the bottom of the stairs. If you do not have them, ask someone to add them for you. What else  can I do to help prevent falls? Wear shoes that: Do not have high heels. Have rubber bottoms. Are comfortable and fit you well. Are closed at the toe. Do not wear sandals. If you use a stepladder: Make sure that it is fully opened. Do not climb a closed stepladder. Make sure that both sides of the stepladder are locked into place. Ask someone to hold it for you, if possible. Clearly mark and make sure that you can see: Any grab bars or handrails. First and last steps. Where the edge of each step is. Use tools that help you move around (mobility aids) if they are needed. These include: Canes. Walkers. Scooters. Crutches. Turn on the lights when you go into a dark area. Replace any light bulbs as soon as they burn out. Set up your furniture so you have a clear path. Avoid moving your furniture around. If any of your floors are uneven, fix them. If there are any pets around you, be aware of where they are. Review your medicines with your doctor. Some medicines can make you feel dizzy. This can increase your chance of falling. Ask your doctor what other things that you can do to help prevent falls. This information is not intended to replace advice given to you by your health care provider. Make sure you discuss any questions you have with your health care provider. Document Released: 07/02/2009 Document Revised: 02/11/2016 Document Reviewed: 10/10/2014 Elsevier Interactive Patient Education  2017 [employer].

## 2023-02-23 NOTE — Progress Notes (Signed)
Subjective:   George Fitzgerald is a 72 y.o. male who presents for Medicare Annual/Subsequent preventive examination.  Review of Systems    Virtual Visit via Telephone Note  I connected with  George Fitzgerald on 02/23/23 at 10:15 AM EDT by telephone and verified that I am speaking with the correct person using two identifiers.  Location: Patient: Home  Provider: Office Persons participating in the virtual visit: patient/Nurse Health Advisor   I discussed the limitations, risks, security and privacy concerns of performing an evaluation and management service by telephone and the availability of in person appointments. The patient expressed understanding and agreed to proceed.  Interactive audio and video telecommunications were attempted between this nurse and patient, however failed, due to patient having technical difficulties OR patient did not have access to video capability.  We continued and completed visit with audio only.  Some vital signs may be absent or patient reported.   Tillie Rung, LPN  Cardiac Risk Factors include: advanced age (>51men, >13 women);male gender;dyslipidemia     Objective:    Today's Vitals   02/23/23 1017  Weight: 179 lb (81.2 kg)  Height: 6\' 2"  (1.88 m)   Body mass index is 22.98 kg/m.     02/23/2023   10:27 AM 02/10/2022    2:32 PM 02/09/2021   12:05 PM 07/28/2011    7:00 AM 07/25/2011    3:03 PM  Advanced Directives  Does Patient Have a Medical Advance Directive? No No Yes  Patient does not have advance directive  Type of Advance Directive   Living will;Healthcare Power of Attorney    Does patient want to make changes to medical advance directive?   No - Patient declined    Copy of Healthcare Power of Attorney in Chart?   No - copy requested    Would patient like information on creating a medical advance directive? No - Patient declined No - Patient declined     Pre-existing out of facility DNR order (yellow form or pink MOST form)    No      Current Medications (verified) Outpatient Encounter Medications as of 02/23/2023  Medication Sig   allopurinol (ZYLOPRIM) 300 MG tablet TAKE 1 TABLET BY MOUTH DAILY   Ascorbic Acid (SUPER C COMPLEX PO) Take 2 tablets by mouth daily.   aspirin EC 81 MG tablet Take 81 mg by mouth daily. Swallow whole.   atorvastatin (LIPITOR) 40 MG tablet TAKE 1 TABLET BY MOUTH DAILY   B Complex-C (SUPER B COMPLEX PO) Take 2 tablets by mouth daily.   Cholecalciferol (VITAMIN D3) 25 MCG (1000 UT) CAPS Take 3,000 Units by mouth daily.   colchicine 0.6 MG tablet Take 1 tablet (0.6 mg total) by mouth 4 (four) times daily as needed.   Cyanocobalamin (VITAMIN B-12) 1000 MCG SUBL Place 1 tablet (1,000 mcg total) under the tongue daily.   indomethacin (INDOCIN) 50 MG capsule Take 1 capsule (50 mg total) by mouth 3 (three) times daily as needed.   vitamin E 180 MG (400 UNITS) capsule Take 800 Units by mouth daily.   No facility-administered encounter medications on file as of 02/23/2023.    Allergies (verified) Patient has no known allergies.   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   Varicose  veins    L   Past Surgical History:  Procedure Laterality Date   COLONOSCOPY     MASS EXCISION  07/28/2011   Procedure: EXCISION MASS/ left foot;  Surgeon: Toni Arthurs, MD;  Location: [facility];  Service: Orthopedics;  Laterality: Left;  left foot ganglion cyst excision   VARICOSE VEIN SURGERY  2011   left leg   Family History  Problem Relation Age of Onset   Stroke Mother 60   Nephritis Father 34   Kidney disease Father 39       nephritis   Social History   Socioeconomic History   Marital status: Married    Spouse name: Not on file   Number of children: 1   Years of education: Not on file    Highest education level: Bachelor's degree (e.g., BA, AB, BS)  Occupational History   Occupation: retired  Tobacco Use   Smoking status: Never   Smokeless tobacco: Never  Substance and Sexual Activity   Alcohol use: Yes    Alcohol/week: 14.0 standard drinks of alcohol    Types: 14 Glasses of wine per week   Drug use: No   Sexual activity: Yes  Other Topics Concern   Not on file  Social History Narrative   Regular exercise-yes,sailor   Social Determinants of Health   Financial Resource Strain: Low Risk  (02/23/2023)   Overall Financial Resource Strain (CARDIA)    Difficulty of Paying Living Expenses: Not hard at all  Food Insecurity: No Food Insecurity (02/23/2023)   Hunger Vital Sign    Worried About Running Out of Food in the Last Year: Never true    Ran Out of Food in the Last Year: Never true  Transportation Needs: No Transportation Needs (02/23/2023)   PRAPARE - Administrator, Civil Service (Medical): No    Lack of Transportation (Non-Medical): No  Physical Activity: Sufficiently Active (02/23/2023)   Exercise Vital Sign    Days of Exercise per Week: 4 days    Minutes of Exercise per Session: 130 min  Recent Concern: Physical Activity - Insufficiently Active (12/16/2022)   Exercise Vital Sign    Days of Exercise per Week: 3 days    Minutes of Exercise per Session: 30 min  Stress: No Stress Concern Present (02/23/2023)   [employer] of Occupational Health - Occupational Stress Questionnaire    Feeling of Stress : Not at all  Social Connections: Socially Integrated (02/23/2023)   Social Connection and Isolation Panel [NHANES]    Frequency of Communication with Friends and Family: More than three times a week    Frequency of Social Gatherings with Friends and Family: More than three times a week    Attends Religious Services: More than 4 times per year    Active Member of [employer] or Organizations: Yes    Attends Engineer, structural: More than 4 times  per year    Marital Status: Married    Tobacco Counseling Counseling given: Not Answered   Clinical Intake:  Pre-visit preparation completed: Yes  Pain : No/denies pain     BMI - recorded: 22.98 Nutritional Status: BMI of 19-24  Normal Nutritional Risks: None Diabetes: No  How often do you need to have someone help you when you read instructions, pamphlets, or other written materials from your doctor or pharmacy?: 1 - Never  Diabetic?  No  Interpreter Needed?: No  Information entered by :: Theresa Mulligan LPN   Activities of Daily Living  02/23/2023   10:25 AM 02/21/2023    2:52 PM  In your present state of health, do you have any difficulty performing the following activities:  Hearing? 0 0  Vision? 0 0  Difficulty concentrating or making decisions? 0 0  Walking or climbing stairs? 0 0  Dressing or bathing? 0 0  Doing errands, shopping? 0 0  Preparing Food and eating ? N N  Using the Toilet? N N  In the past six months, have you accidently leaked urine? N N  Do you have problems with loss of bowel control? N N  Managing your Medications? N N  Managing your Finances? N N  Housekeeping or managing your Housekeeping? N N    Patient Care Team: Plotnikov, Georgina Quint, MD as PCP - General Meryl Dare, MD (Gastroenterology) Carman Ching, OD as Consulting Physician (Optometry) Parke Poisson, MD as Consulting Physician (Cardiology)  Indicate any recent Medical Services you may have received from other than [facility] providers in the past year (date may be approximate).     Assessment:   This is a routine wellness examination for George Fitzgerald.  Hearing/Vision screen Hearing Screening - Comments:: Denies hearing difficulties   Vision Screening - Comments::  - Up to date with routine eye exams with  [facility]  Dietary issues and exercise activities discussed: Current Exercise Habits: Home exercise routine, Time (Minutes): > 60, Frequency (Times/Week): 4,  Weekly Exercise (Minutes/Week): 0, Intensity: Moderate, Exercise limited by: None identified   Goals Addressed               This Visit's Progress     My goal is to stay well and healthy. (pt-stated)        I want to travel more       Depression Screen    02/23/2023   10:24 AM 12/20/2022   10:12 AM 02/10/2022    2:47 PM 02/09/2021   12:00 PM 01/04/2020   10:45 PM  PHQ 2/9 Scores  PHQ - 2 Score 0 2 0 0 0  PHQ- 9 Score  3       Fall Risk    02/23/2023   10:26 AM 02/21/2023    2:52 PM 12/20/2022   10:12 AM 02/10/2022    2:33 PM 02/09/2021   12:05 PM  Fall Risk   Falls in the past year? 0 0 0 0 1  Number falls in past yr: 0 0 0 0 0  Injury with Fall? 0 0 0 0 1  Risk for fall due to : No Fall Risks  No Fall Risks No Fall Risks Other (Comment)  Risk for fall due to: Comment     syncope & collapse  Follow up Falls prevention discussed  Falls evaluation completed Falls evaluation completed Falls evaluation completed    FALL RISK PREVENTION PERTAINING TO THE HOME:  Any stairs in or around the home? Yes  If so, are there any without handrails? No  Home free of loose throw rugs in walkways, pet beds, electrical cords, etc? Yes  Adequate lighting in your home to reduce risk of falls? Yes   ASSISTIVE DEVICES UTILIZED TO PREVENT FALLS:  Life alert? No  Use of a cane, walker or w/c? No  Grab bars in the bathroom? Yes Shower chair or bench in shower? Yes Elevated toilet seat or a handicapped toilet? Yes  TIMED UP AND GO:  Was the test performed? No . Audio Visit   Cognitive Function:  02/23/2023   10:27 AM 02/10/2022    2:50 PM  6CIT Screen  What Year? 0 points 0 points  What month? 0 points 0 points  What time? 0 points 0 points  Count back from 20 0 points 0 points  Months in reverse 0 points 0 points  Repeat phrase 0 points 0 points  Total Score 0 points 0 points    Immunizations Immunization History  Administered Date(s) Administered   Influenza Split  06/29/2011   Influenza,inj,Quad PF,6+ Mos 08/20/2013, 05/20/2015   Influenza-Unspecified 08/04/2016   Moderna Sars-Covid-2 Vaccination 11/02/2019, 11/30/2019, 05/22/2020   Pneumococcal Conjugate-13 02/23/2021   Pneumococcal Polysaccharide-23 05/20/2015   Tdap 02/23/2011, 02/23/2021    TDAP status: Up to date  Flu Vaccine status: Up to date  Pneumococcal vaccine status: Up to date  Covid-19 vaccine status: Completed vaccines    Screening Tests Health Maintenance  Topic Date Due   COVID-19 Vaccine (4 - 2023-24 season) 03/11/2023 (Originally 05/20/2022)   INFLUENZA VACCINE  04/20/2023   Medicare Annual Wellness (AWV)  02/23/2024   Colonoscopy  04/02/2029   DTaP/Tdap/Td (3 - Td or Tdap) 02/24/2031   Pneumonia Vaccine 69+ Years old  Completed   Hepatitis C Screening  Completed   HPV VACCINES  Aged Out   Zoster Vaccines- Shingrix  Discontinued    Health Maintenance  There are no preventive care reminders to display for this patient.   Colorectal cancer screening: Type of screening: Colonoscopy. Completed 04/03/19. Repeat every 10 years  Lung Cancer Screening: (Low Dose CT Chest recommended if Age 77-80 years, 30 pack-year currently smoking OR have quit w/in 15years.) does not qualify.     Additional Screening:  Hepatitis C Screening: does qualify; Completed 05/11/16  Vision Screening: Recommended annual ophthalmology exams for early detection of glaucoma and other disorders of the eye. Is the patient up to date with their annual eye exam?  Yes  Who is the provider or what is the name of the office in which the patient attends annual eye exams? [facility] If pt is not established with a provider, would they like to be referred to a provider to establish care? No .   Dental Screening: Recommended annual dental exams for proper oral hygiene  Community Resource Referral / Chronic Care Management:  CRR required this visit?  No   CCM required this visit?  No       Plan:     I have personally reviewed and noted the following in the patient's chart:   Medical and social history Use of alcohol, tobacco or illicit drugs  Current medications and supplements including opioid prescriptions. Patient is not currently taking opioid prescriptions. Functional ability and status Nutritional status Physical activity Advanced directives List of other physicians Hospitalizations, surgeries, and ER visits in previous 12 months Vitals Screenings to include cognitive, depression, and falls Referrals and appointments  In addition, I have reviewed and discussed with patient certain preventive protocols, quality metrics, and best practice recommendations. A written personalized care plan for preventive services as well as general preventive health recommendations were provided to patient.     Tillie Rung, LPN   09/24/1094   Nurse Notes:   None

## 2023-02-24 ENCOUNTER — Encounter: Payer: Self-pay | Admitting: Internal Medicine

## 2023-02-24 ENCOUNTER — Ambulatory Visit (INDEPENDENT_AMBULATORY_CARE_PROVIDER_SITE_OTHER): Payer: Medicare Other | Admitting: Internal Medicine

## 2023-02-24 VITALS — BP 122/76 | HR 73 | Temp 97.7°F | Ht 74.0 in | Wt 176.0 lb

## 2023-02-24 DIAGNOSIS — N1831 Chronic kidney disease, stage 3a: Secondary | ICD-10-CM

## 2023-02-24 DIAGNOSIS — M7022 Olecranon bursitis, left elbow: Secondary | ICD-10-CM | POA: Diagnosis not present

## 2023-02-24 DIAGNOSIS — M7032 Other bursitis of elbow, left elbow: Secondary | ICD-10-CM | POA: Insufficient documentation

## 2023-02-24 DIAGNOSIS — M7030 Other bursitis of elbow, unspecified elbow: Secondary | ICD-10-CM | POA: Insufficient documentation

## 2023-02-24 NOTE — Assessment & Plan Note (Signed)
Due to overuse.  Use elbow brace.  Blue-Emu cream was recommended to use 2-3 times a day The patient declined steroid injection. Minimal amount of effusion, not worth of aspiration

## 2023-02-24 NOTE — Patient Instructions (Signed)
Blue-Emu cream -- use 2-3 times a day ? ?

## 2023-02-24 NOTE — Progress Notes (Signed)
Subjective:  Patient ID: George Fitzgerald, male    DOB: 1951/04/26  Age: 72 y.o. MRN: [id number]  CC: Medical Management of Chronic Issues (Elbow )   HPI George Fitzgerald presents for L elbow bursitis Decreased GFR, history of elevated blood pressure  Outpatient Medications Prior to Visit  Medication Sig Dispense Refill   allopurinol (ZYLOPRIM) 300 MG tablet TAKE 1 TABLET BY MOUTH DAILY 90 tablet 3   Ascorbic Acid (SUPER C COMPLEX PO) Take 2 tablets by mouth daily.     aspirin EC 81 MG tablet Take 81 mg by mouth daily. Swallow whole.     atorvastatin (LIPITOR) 40 MG tablet TAKE 1 TABLET BY MOUTH DAILY 90 tablet 3   B Complex-C (SUPER B COMPLEX PO) Take 2 tablets by mouth daily.     Cholecalciferol (VITAMIN D3) 25 MCG (1000 UT) CAPS Take 3,000 Units by mouth daily.     Cyanocobalamin (VITAMIN B-12) 1000 MCG SUBL Place 1 tablet (1,000 mcg total) under the tongue daily. 100 tablet 3   indomethacin (INDOCIN) 50 MG capsule Take 1 capsule (50 mg total) by mouth 3 (three) times daily as needed. 60 capsule 3   vitamin E 180 MG (400 UNITS) capsule Take 800 Units by mouth daily.     colchicine 0.6 MG tablet Take 1 tablet (0.6 mg total) by mouth 4 (four) times daily as needed. 30 tablet 5   No facility-administered medications prior to visit.    ROS: Review of Systems  Constitutional:  Negative for appetite change, fatigue and unexpected weight change.  HENT:  Negative for congestion, nosebleeds, sneezing, sore throat and trouble swallowing.   Eyes:  Negative for itching and visual disturbance.  Respiratory:  Negative for cough.   Cardiovascular:  Negative for chest pain, palpitations and leg swelling.  Gastrointestinal:  Negative for abdominal distention, blood in stool, diarrhea and nausea.  Genitourinary:  Negative for frequency and hematuria.  Musculoskeletal:  Negative for back pain, gait problem, joint swelling and neck pain.  Skin:  Negative for rash.  Neurological:  Negative for dizziness,  tremors, speech difficulty and weakness.  Psychiatric/Behavioral:  Negative for agitation, dysphoric mood and sleep disturbance. The patient is not nervous/anxious.     Objective:  BP 122/76 (BP Location: Left Arm, Patient Position: Sitting, Cuff Size: Normal)   Pulse 73   Temp 97.7 F (36.5 C) (Oral)   Ht 6\' 2"  (1.88 m)   Wt 176 lb (79.8 kg)   SpO2 98%   BMI 22.60 kg/m   BP Readings from Last 3 Encounters:  02/24/23 122/76  12/20/22 110/78  04/15/21 126/70    Wt Readings from Last 3 Encounters:  02/24/23 176 lb (79.8 kg)  02/23/23 179 lb (81.2 kg)  12/20/22 179 lb (81.2 kg)    Physical Exam Constitutional:      General: He is not in acute distress.    Appearance: He is well-developed.     Comments: NAD  Eyes:     Conjunctiva/sclera: Conjunctivae normal.     Pupils: Pupils are equal, round, and reactive to light.  Neck:     Thyroid: No thyromegaly.     Vascular: No JVD.  Cardiovascular:     Rate and Rhythm: Normal rate and regular rhythm.     Heart sounds: Normal heart sounds. No murmur heard.    No friction rub. No gallop.  Pulmonary:     Effort: Pulmonary effort is normal. No respiratory distress.     Breath sounds: Normal breath sounds.  No wheezing or rales.  Chest:     Chest wall: No tenderness.  Abdominal:     General: Bowel sounds are normal. There is no distension.     Palpations: Abdomen is soft. There is no mass.     Tenderness: There is no abdominal tenderness. There is no guarding or rebound.  Musculoskeletal:        General: Deformity present. No tenderness. Normal range of motion.     Cervical back: Normal range of motion.  Lymphadenopathy:     Cervical: No cervical adenopathy.  Skin:    General: Skin is warm and dry.     Findings: No rash.  Neurological:     Mental Status: He is alert and oriented to person, place, and time.     Cranial Nerves: No cranial nerve deficit.     Motor: No abnormal muscle tone.     Coordination: Coordination  normal.     Gait: Gait normal.     Deep Tendon Reflexes: Reflexes are normal and symmetric.  Psychiatric:        Behavior: Behavior normal.        Thought Content: Thought content normal.        Judgment: Judgment normal.   Left posterior elbow with minimal swelling nontender.  No redness  Lab Results  Component Value Date   WBC 5.5 12/20/2022   HGB 14.1 12/20/2022   HCT 41.0 12/20/2022   PLT 177.0 12/20/2022   GLUCOSE 93 12/20/2022   CHOL 203 (H) 12/20/2022   TRIG 45.0 12/20/2022   HDL 125.40 12/20/2022   LDLDIRECT 131.6 03/15/2012   LDLCALC 69 12/20/2022   ALT 33 12/20/2022   AST 44 (H) 12/20/2022   NA 136 12/20/2022   K 4.6 12/20/2022   CL 102 12/20/2022   CREATININE 1.29 12/20/2022   BUN 23 12/20/2022   CO2 26 12/20/2022   TSH 5.17 12/20/2022   PSA 1.57 12/20/2022   HGBA1C 5.6 08/28/2018    CT CARDIAC SCORING  Addendum Date: 01/10/2020   ADDENDUM REPORT: 01/10/2020 11:22 CLINICAL DATA:  Risk stratification, CAD monitoring, dyslipidemia EXAM: Coronary Calcium Score TECHNIQUE: The patient was scanned on a [employer] scanner. Axial non-contrast 3 mm slices were carried out through the heart. The data set was analyzed on a dedicated work station and scored using the Agatson method. FINDINGS: Non-cardiac: See separate report from [facility]. Ascending Aorta: 37 mm at mid ascending aorta measured in an axial plane. Pericardium: Normal, trace pericardial fluid. Coronary arteries: Normal coronary origins. IMPRESSION: Coronary calcium score of 1173. This was 87th percentile for age and sex matched control. Electronically Signed   By: Weston Brass   On: 01/10/2020 11:22   Result Date: 01/10/2020 EXAM: OVER-READ INTERPRETATION  CT CHEST The following report is an over-read performed by radiologist Dr. Trudie Reed of [facility] on 01/09/2020. This over-read does not include interpretation of cardiac or coronary anatomy or pathology. The coronary  calcium score interpretation by the cardiologist is attached. COMPARISON:  None. FINDINGS: Within the visualized portions of the thorax there are no suspicious appearing pulmonary nodules or masses, there is no acute consolidative airspace disease, no pleural effusions, no pneumothorax and no lymphadenopathy. Visualized portions of the upper abdomen demonstrates mild diffuse low attenuation throughout the visualized hepatic parenchyma. There are no aggressive appearing lytic or blastic lesions noted in the visualized portions of the skeleton. IMPRESSION: 1. Mild diffuse hepatic steatosis. Electronically Signed: By: Trudie Reed M.D. On: 01/09/2020 16:35  Assessment & Plan:   Problem List Items Addressed This Visit     Bursitis of elbow - Primary    Due to overuse.  Use elbow brace.  Blue-Emu cream was recommended to use 2-3 times a day The patient declined steroid injection. Minimal amount of effusion, not worth of aspiration       Chronic kidney disease (CKD) stage G3a/A1, moderately decreased glomerular filtration rate (GFR) between 45-59 mL/min/1.73 square meter and albuminuria creatinine ratio less than 30 mg/g (HCC)    GFR 54 Hydrate well. Avoid NSAIDs         No orders of the defined types were placed in this encounter.     Follow-up: Return in about 3 months (around 05/27/2023) for a follow-up visit.  Sonda Primes, MD

## 2023-02-27 DIAGNOSIS — N1831 Chronic kidney disease, stage 3a: Secondary | ICD-10-CM | POA: Insufficient documentation

## 2023-02-27 NOTE — Assessment & Plan Note (Signed)
GFR 54 Hydrate well. Avoid NSAIDs

## 2023-02-27 NOTE — Assessment & Plan Note (Signed)
Avoid NSAIDs 

## 2023-03-13 ENCOUNTER — Encounter: Payer: Self-pay | Admitting: Internal Medicine

## 2023-03-13 ENCOUNTER — Ambulatory Visit: Payer: Medicare Other | Attending: Internal Medicine | Admitting: Internal Medicine

## 2023-03-13 VITALS — BP 136/72 | HR 66 | Ht 74.0 in | Wt 177.6 lb

## 2023-03-13 DIAGNOSIS — R55 Syncope and collapse: Secondary | ICD-10-CM | POA: Insufficient documentation

## 2023-03-13 DIAGNOSIS — R931 Abnormal findings on diagnostic imaging of heart and coronary circulation: Secondary | ICD-10-CM

## 2023-03-13 DIAGNOSIS — I951 Orthostatic hypotension: Secondary | ICD-10-CM

## 2023-03-13 DIAGNOSIS — R42 Dizziness and giddiness: Secondary | ICD-10-CM

## 2023-03-13 DIAGNOSIS — R7989 Other specified abnormal findings of blood chemistry: Secondary | ICD-10-CM | POA: Diagnosis not present

## 2023-03-13 DIAGNOSIS — E785 Hyperlipidemia, unspecified: Secondary | ICD-10-CM | POA: Diagnosis not present

## 2023-03-13 DIAGNOSIS — I10 Essential (primary) hypertension: Secondary | ICD-10-CM | POA: Diagnosis not present

## 2023-03-13 IMAGING — DX DG RIBS 2V*R*
2 series · 2 of 2 positions shown · non-contrast
Comparison: None.

CLINICAL DATA: Fall, right posterior rib pain

EXAM:
RIGHT RIBS - 2 VIEW

[rib ap]
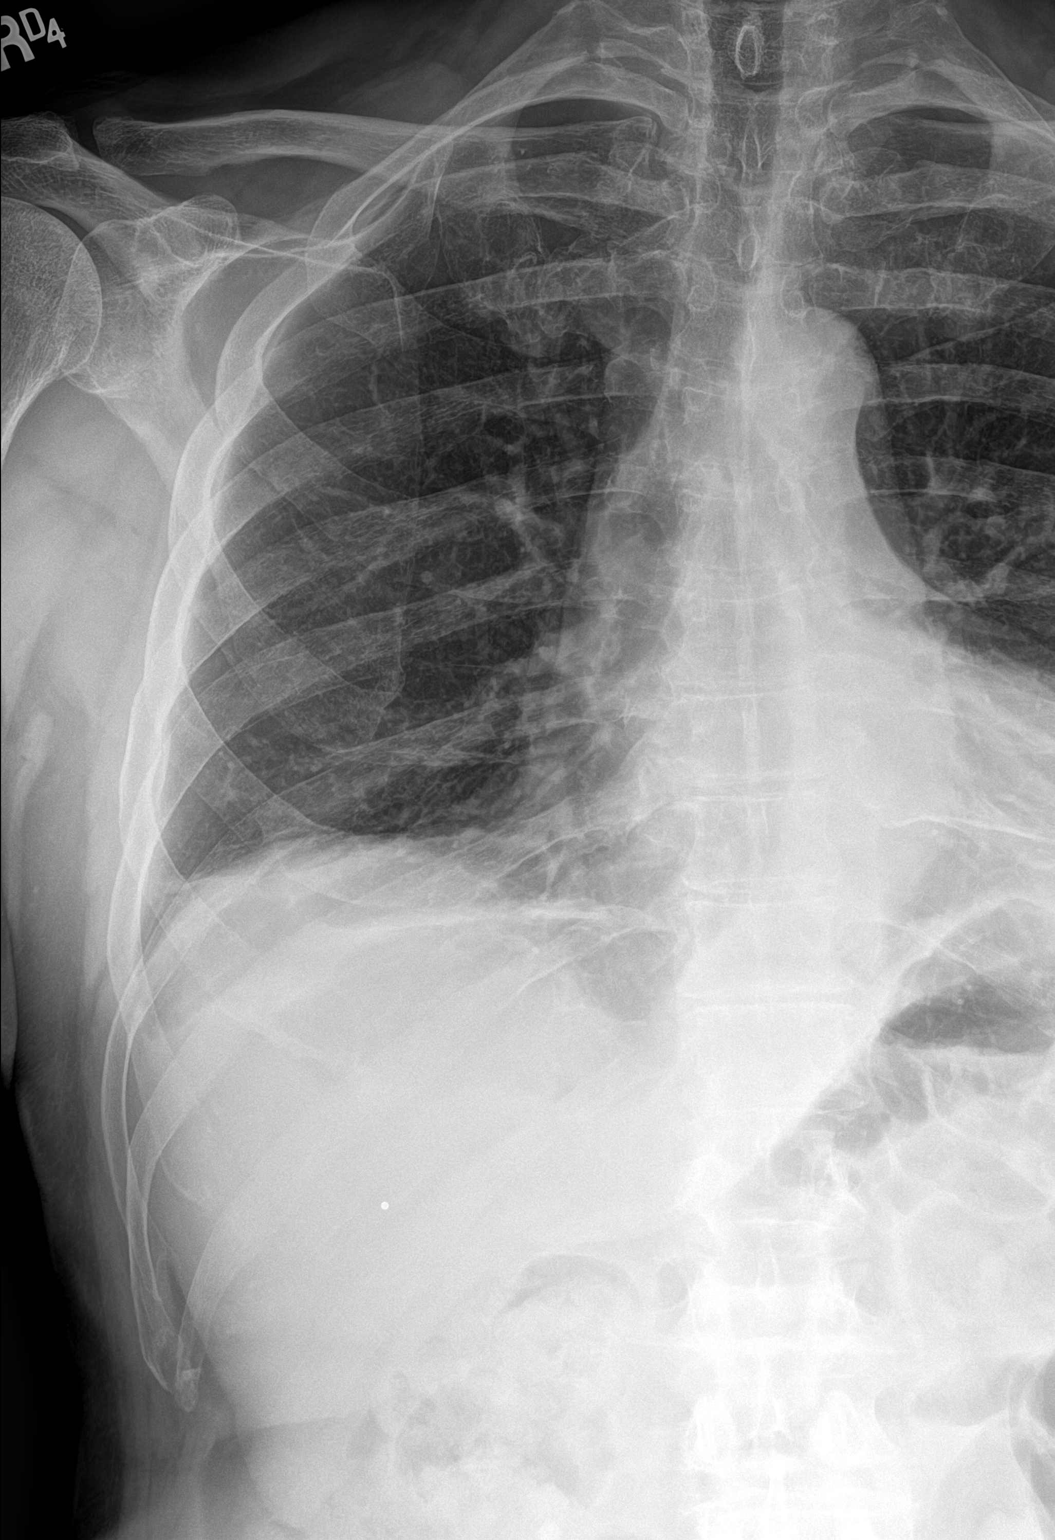

[rib obl]
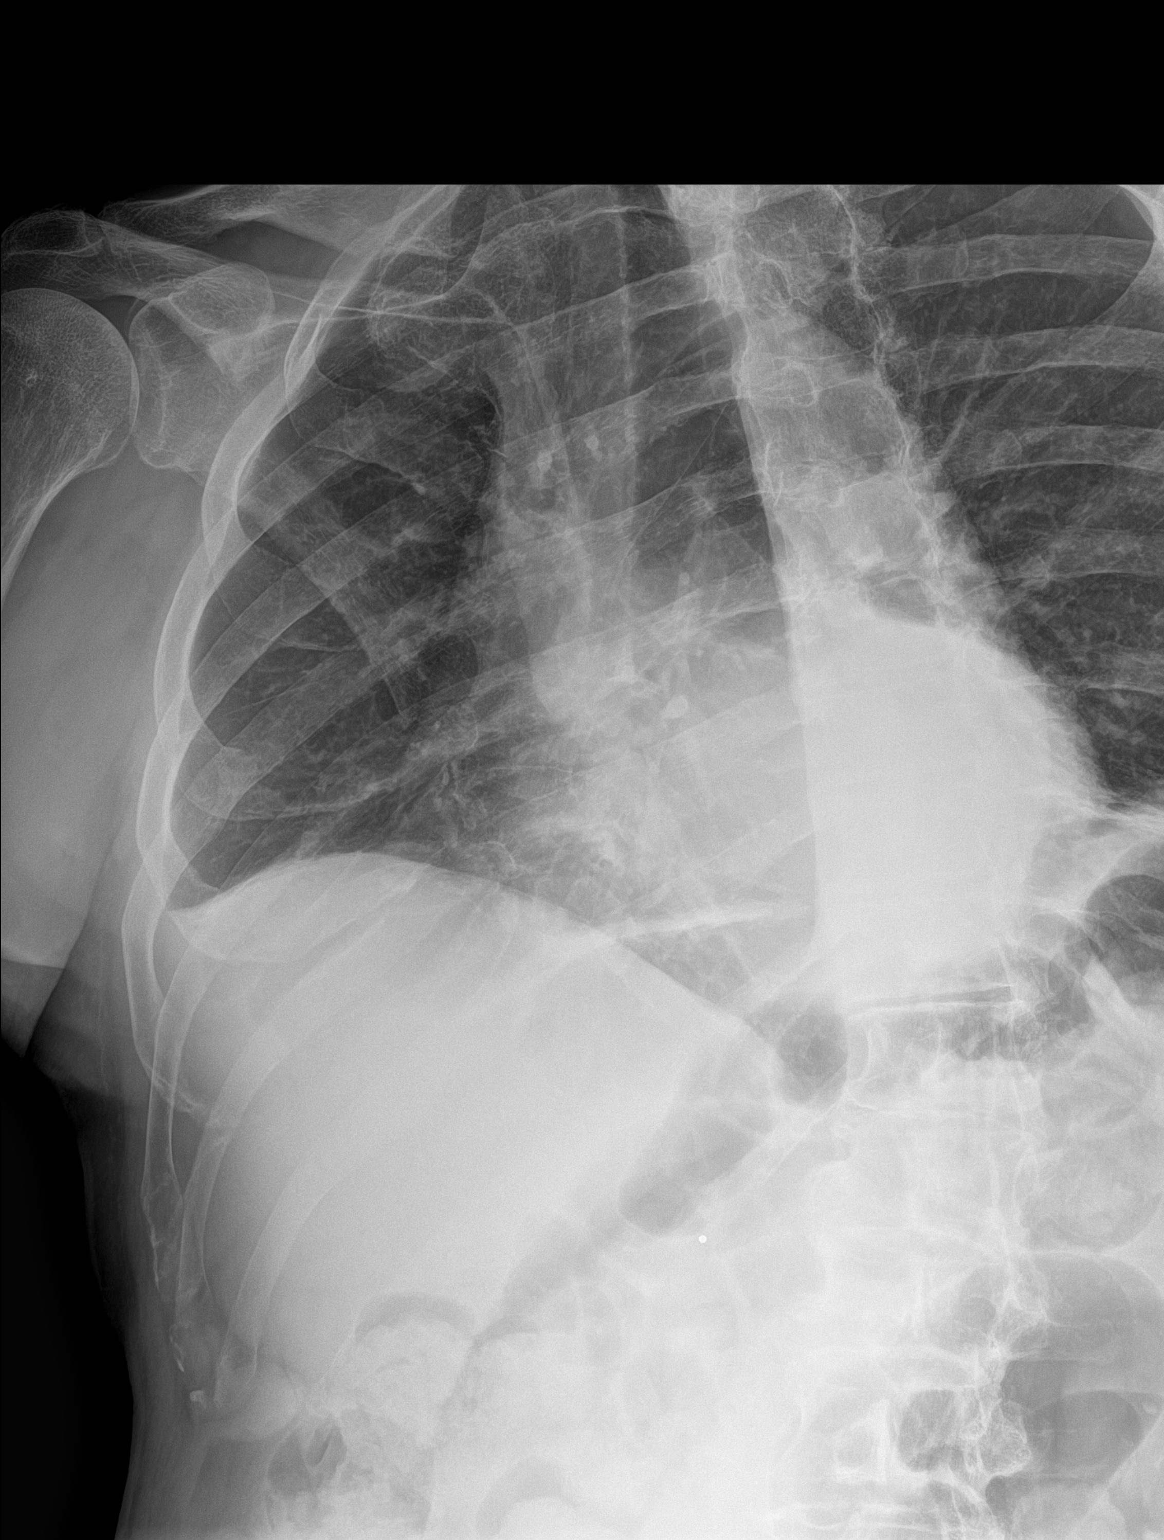

[2 of 2 positions shown; findings below may reference images not displayed]

FINDINGS: No fracture or other bone lesions are seen involving the ribs.
IMPRESSION: Negative.

## 2023-03-13 NOTE — Progress Notes (Signed)
Cardiology Office Note:    Date:  03/13/2023   ID:  George Fitzgerald, DOB 24-Aug-1951, MRN [id number]  PCP:  Tresa Garter, MD  Cardiologist:  None  Electrophysiologist:  None   Referring MD: Tresa Garter, MD   Chief Complaint/Reason for Referral: CAD, venous insufficiency  History of Present Illness:    George Fitzgerald is a 72 y.o. male with a history of  coronary artery calcifications and hyperlipidemia who presents for follow up.  Doing very well overall.  Goes to the gym at least 4 times a week for several hours.  No chest pain no shortness of breath.  Has been traveling extensively and has returned to racing sailboats.  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  Initially we had had concerns about prior syncope but he has not had interval syncope since our last visit.  May be reasonable to return to solo skydiving which would be his preference.  LDL appropriate today 69, he has made excellent dietary changes.  Overall enjoying good quality of life with no symptoms.  The patient denies chest pain, chest pressure, dyspnea at rest or with exertion, palpitations, PND, orthopnea, or leg swelling. Denies cough, fever, chills. Denies nausea, vomiting. Denies syncope or presyncope. Denies dizziness or lightheadedness.     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   aspirin EC 81 MG tablet Take 81 mg by mouth daily. Swallow whole.   atorvastatin (LIPITOR) 40 MG tablet TAKE 1 TABLET BY MOUTH DAILY   B Complex-C (SUPER B COMPLEX PO) Take 2 tablets by mouth daily.   Cholecalciferol (VITAMIN D3) 25 MCG (1000 UT) CAPS Take 3,000 Units by mouth daily.   Cyanocobalamin (VITAMIN B-12) 1000 MCG SUBL Place 1 tablet (1,000 mcg total) under the tongue daily.   indomethacin (INDOCIN) 50 MG capsule Take 1 capsule (50 mg total) by mouth 3 (three) times daily as needed.   vitamin E 180 MG (400 UNITS) capsule Take 800 Units by mouth daily.     Allergies:   Patient has no known allergies.   Social History   Tobacco Use   Smoking status: Never   Smokeless tobacco: Never  Substance Use Topics   Alcohol use: Yes    Alcohol/week: 14.0 standard drinks of alcohol    Types: 14 Glasses of wine per week   Drug use: No     Family History: The patient's family history includes Kidney disease (age of onset: 27) in his father; Nephritis (age of onset: 69) in his father; Stroke (age of onset: 51) in his mother.  ROS:   Please see the history of present illness.   (+)  dizziness (+) left LE, heaviness All other systems reviewed and are negative.  EKGs/Labs/Other Studies Reviewed:    The following studies were reviewed today:  Lexiscan 03/03/2020:  The left ventricular ejection fraction is normal (55-65%). Nuclear stress EF: 61%. There was no ST segment deviation noted during stress. There is a small defect of mild severity present in the apical inferior location. The defect is non-reversible and consistent with diaphragmatic attenuation artifact and extracardiac gut uptake. No ischemia noted. This is a low risk study.   Calcium Scoring 01/09/2020  Coronary calcium score of 1173. This was 87th percentile for age and sex matched  control.   EKG:  EKG Interpretation  Date/Time:  Monday March 13 2023 07:52:39 EDT Ventricular Rate:  66 PR Interval:  288 QRS Duration: 100 QT Interval:  424 QTC Calculation: 444 R Axis:   37 Text Interpretation: Sinus rhythm with 1st degree A-V block Incomplete right bundle branch block Nonspecific T wave abnormality Confirmed by Weston Brass ([id number]) on 03/13/2023 8:14:55 AM   04/15/2021: not obtained. 01/11/2021:  SR 1st deg AVB, iRBBB  Recent Labs: 12/20/2022: ALT 33; BUN 23; Creatinine, Ser 1.29; Hemoglobin 14.1; Platelets 177.0; Potassium 4.6; Sodium 136; TSH 5.17  Recent Lipid Panel    Component Value Date/Time   CHOL 203 (H) 12/20/2022 1055   CHOL 183 05/11/2020 1219   TRIG 45.0 12/20/2022 1055   HDL 125.40 12/20/2022 1055   HDL 128 05/11/2020 1219   CHOLHDL 2 12/20/2022 1055   VLDL 9.0 12/20/2022 1055   LDLCALC 69 12/20/2022 1055   LDLCALC 46 05/11/2020 1219   LDLDIRECT 131.6 03/15/2012 0754    Physical Exam:    VS:  BP 136/72 (BP Location: Left Arm, Patient Position: Sitting, Cuff Size: Normal)   Pulse 66   Ht 6\' 2"  (1.88 m)   Wt 177 lb 9.6 oz (80.6 kg)   SpO2 96%   BMI 22.80 kg/m     Wt Readings from Last 5 Encounters:  03/13/23 177 lb 9.6 oz (80.6 kg)  02/24/23 176 lb (79.8 kg)  02/23/23 179 lb (81.2 kg)  12/20/22 179 lb (81.2 kg)  04/15/21 183 lb (83 kg)    Constitutional: No acute distress Eyes: sclera non-icteric, normal conjunctiva and lids ENMT: normal dentition, moist mucous membranes Cardiovascular: regular rhythm, normal rate, no murmur. S1 and S2 normal. No jugular venous distention.  Respiratory: clear to auscultation bilaterally GI : normal bowel sounds, soft and nontender. No distention.   MSK: extremities warm, well perfused. No edema.  NEURO: grossly nonfocal exam, moves all extremities. PSYCH: alert and oriented x 3, normal mood and affect.   ASSESSMENT:    1. Dizziness   2. Syncope and collapse   3. Orthostatic hypotension    4. Dyslipidemia   5. High serum high density lipoprotein (HDL)   6. Agatston coronary artery calcium score greater than 400   7. Essential hypertension     PLAN:    Dizziness -  Syncope and collapse Orthostatic hypotension -Syncope and dizziness have resolved.  Now off of losartan and blood pressures are normal.  Coronary artery calcifications Dyslipidemia High HDL - continue taking atorvastatin 40 mg daily and ASA 81 mg daily. CAC score 1173, no ischemia on nuc.  Discussed more aggressive goal of LDL less than 55 if possible.  Could consider increasing atorvastatin to 80 mg daily, we will await labs this fall to see if diet and lifestyle changes make any impact.  This is due to significantly elevated calcium  score.  Essential hypertension -stable off of losartan, observe. BP normal today.    Total time of encounter: 25 minutes total time of encounter, including 17 minutes spent in face-to-face patient care on the date of this encounter. This time includes coordination of care and counseling regarding above mentioned problem list. Remainder of non-face-to-face time involved reviewing chart documents/testing relevant to the patient encounter and documentation in the medical record. I have independently reviewed documentation from referring provider.   Follow-up in 1 year.  Weston Brass, MD, [facility] [employer]  [facility]    Medication Adjustments/Labs and Tests Ordered: Current medicines are reviewed at length with the patient today.  Concerns regarding medicines are outlined above.   Orders Placed This Encounter  Procedures   EKG 12-Lead     No orders of the defined types were placed in this encounter.    Patient Instructions  Medication Instructions:  Your physician recommends that you continue on your current medications as directed. Please refer to the Current Medication list given to you today.  *If you need a refill on your cardiac medications before your  next appointment, please call your pharmacy*    Follow-Up: At [facility], you and your health needs are our priority.  As part of our continuing mission to provide you with exceptional heart care, we have created designated Provider Care Teams.  These Care Teams include your primary Cardiologist (physician) and Advanced Practice Providers (APPs -  Physician Assistants and Nurse Practitioners) who all work together to provide you with the care you need, when you need it.  We recommend signing up for the patient portal called "MyChart".  Sign up information is provided on this After Visit Summary.  MyChart is used to connect with patients for Virtual Visits (Telemedicine).  Patients are able to view lab/test results, encounter notes, upcoming appointments, etc.  Non-urgent messages can be sent to your provider as well.   To learn more about what you can do with MyChart, go to [email or web address].    Your next appointment:   12 month(s)  Provider:   Weston Brass, MD

## 2023-03-13 NOTE — Patient Instructions (Signed)
Medication Instructions:  Your physician recommends that you continue on your current medications as directed. Please refer to the Current Medication list given to you today.  *If you need a refill on your cardiac medications before your next appointment, please call your pharmacy*    Follow-Up: At [facility], you and your health needs are our priority.  As part of our continuing mission to provide you with exceptional heart care, we have created designated Provider Care Teams.  These Care Teams include your primary Cardiologist (physician) and Advanced Practice Providers (APPs -  Physician Assistants and Nurse Practitioners) who all work together to provide you with the care you need, when you need it.  We recommend signing up for the patient portal called "MyChart".  Sign up information is provided on this After Visit Summary.  MyChart is used to connect with patients for Virtual Visits (Telemedicine).  Patients are able to view lab/test results, encounter notes, upcoming appointments, etc.  Non-urgent messages can be sent to your provider as well.   To learn more about what you can do with MyChart, go to [email or web address].    Your next appointment:   12 month(s)  Provider:   Weston Brass, MD

## 2023-03-14 ENCOUNTER — Encounter: Payer: Self-pay | Admitting: Internal Medicine

## 2023-04-04 ENCOUNTER — Encounter: Payer: Self-pay | Admitting: Internal Medicine

## 2023-04-05 MED ORDER — ATORVASTATIN CALCIUM 40 MG PO TABS: ORAL_TABLET | ORAL | 2 refills | Status: DC

## 2023-04-05 MED ORDER — ALLOPURINOL 300 MG PO TABS: ORAL_TABLET | ORAL | 2 refills | Status: DC

## 2023-04-05 NOTE — Telephone Encounter (Signed)
No documentation Atorvastatin was increased to 80mg .. pt is saying you all discuss. Pls advise what correct dosage

## 2023-04-10 MED ORDER — ATORVASTATIN CALCIUM 80 MG PO TABS: 80.0000 mg | ORAL_TABLET | Freq: Every day | ORAL | 1 refills | Status: AC

## 2023-04-10 NOTE — Addendum Note (Signed)
Addended by: Deatra Abelino on: 04/10/2023 09:29 AM   Modules accepted: Orders

## 2024-02-07 ENCOUNTER — Encounter: Payer: Self-pay | Admitting: Internal Medicine

## 2024-02-19 ENCOUNTER — Other Ambulatory Visit: Payer: Self-pay | Admitting: Internal Medicine

## 2024-02-29 ENCOUNTER — Ambulatory Visit: Payer: Medicare Other

## 2024-06-28 ENCOUNTER — Other Ambulatory Visit: Payer: Self-pay | Admitting: Internal Medicine

## 2024-11-04 ENCOUNTER — Ambulatory Visit: Admitting: Internal Medicine
